# Patient Record
Sex: Female | Born: 1985 | Race: White | Hispanic: No | Marital: Married | Smoking: Former smoker
Health system: Southern US, Community
[De-identification: ages and names within clinical notes are randomized; demographics above are authoritative.]

## PROBLEM LIST (undated history)

## (undated) DIAGNOSIS — T8859XA Other complications of anesthesia, initial encounter: Secondary | ICD-10-CM

## (undated) DIAGNOSIS — K219 Gastro-esophageal reflux disease without esophagitis: Secondary | ICD-10-CM

## (undated) DIAGNOSIS — M419 Scoliosis, unspecified: Secondary | ICD-10-CM

## (undated) DIAGNOSIS — R112 Nausea with vomiting, unspecified: Secondary | ICD-10-CM

## (undated) DIAGNOSIS — F419 Anxiety disorder, unspecified: Secondary | ICD-10-CM

## (undated) DIAGNOSIS — Z9889 Other specified postprocedural states: Secondary | ICD-10-CM

## (undated) HISTORY — PX: ABDOMINAL HYSTERECTOMY: SHX81

## (undated) HISTORY — DX: Gastro-esophageal reflux disease without esophagitis: K21.9

## (undated) HISTORY — DX: Scoliosis, unspecified: M41.9

## (undated) HISTORY — PX: FRACTURE SURGERY: SHX138

## (undated) HISTORY — DX: Anxiety disorder, unspecified: F41.9

---
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
## 2006-06-03 HISTORY — PX: TUBAL LIGATION: SHX77

## 2006-06-03 HISTORY — PX: PARTIAL HYSTERECTOMY: SHX80

## 2006-06-07 ENCOUNTER — Emergency Department: Payer: Self-pay | Admitting: Emergency Medicine

## 2006-07-23 ENCOUNTER — Emergency Department: Payer: Self-pay | Admitting: Emergency Medicine

## 2006-09-26 ENCOUNTER — Emergency Department: Payer: Self-pay | Admitting: Emergency Medicine

## 2006-10-26 ENCOUNTER — Emergency Department: Payer: Self-pay | Admitting: Emergency Medicine

## 2007-01-29 ENCOUNTER — Emergency Department: Payer: Self-pay

## 2007-02-03 ENCOUNTER — Ambulatory Visit: Payer: Self-pay | Admitting: *Deleted

## 2007-02-05 ENCOUNTER — Inpatient Hospital Stay: Payer: Self-pay | Admitting: Obstetrics & Gynecology

## 2007-09-21 ENCOUNTER — Ambulatory Visit: Payer: Self-pay | Admitting: *Deleted

## 2007-09-25 ENCOUNTER — Emergency Department: Payer: Self-pay | Admitting: Emergency Medicine

## 2007-09-25 ENCOUNTER — Other Ambulatory Visit: Payer: Self-pay

## 2008-03-22 ENCOUNTER — Emergency Department: Payer: Self-pay | Admitting: Unknown Physician Specialty

## 2008-03-24 ENCOUNTER — Ambulatory Visit: Payer: Self-pay | Admitting: Unknown Physician Specialty

## 2008-03-25 ENCOUNTER — Ambulatory Visit: Payer: Self-pay

## 2008-08-11 ENCOUNTER — Emergency Department: Payer: Self-pay | Admitting: Emergency Medicine

## 2008-09-11 ENCOUNTER — Emergency Department: Payer: Self-pay | Admitting: Emergency Medicine

## 2009-04-28 ENCOUNTER — Emergency Department: Payer: Self-pay | Admitting: Emergency Medicine

## 2009-04-29 ENCOUNTER — Emergency Department: Payer: Self-pay | Admitting: Internal Medicine

## 2009-05-25 ENCOUNTER — Emergency Department: Payer: Self-pay | Admitting: Emergency Medicine

## 2009-09-23 ENCOUNTER — Emergency Department: Payer: Self-pay | Admitting: Emergency Medicine

## 2009-11-15 ENCOUNTER — Emergency Department: Payer: Self-pay | Admitting: Emergency Medicine

## 2010-02-07 ENCOUNTER — Emergency Department: Payer: Self-pay | Admitting: Emergency Medicine

## 2013-03-01 DIAGNOSIS — G8929 Other chronic pain: Secondary | ICD-10-CM | POA: Insufficient documentation

## 2015-03-14 ENCOUNTER — Ambulatory Visit: Payer: Self-pay | Admitting: Gastroenterology

## 2015-03-14 ENCOUNTER — Telehealth: Payer: Self-pay | Admitting: Gastroenterology

## 2015-03-14 DIAGNOSIS — K219 Gastro-esophageal reflux disease without esophagitis: Secondary | ICD-10-CM

## 2015-03-14 DIAGNOSIS — M419 Scoliosis, unspecified: Secondary | ICD-10-CM

## 2015-03-14 DIAGNOSIS — F419 Anxiety disorder, unspecified: Secondary | ICD-10-CM

## 2015-03-14 HISTORY — DX: Gastro-esophageal reflux disease without esophagitis: K21.9

## 2015-03-14 HISTORY — DX: Anxiety disorder, unspecified: F41.9

## 2015-03-14 HISTORY — DX: Scoliosis, unspecified: M41.9

## 2015-03-14 NOTE — Telephone Encounter (Signed)
Patient was a no show for 03/14/2015  appointment. Left voice message for her to call and reschedule.

## 2016-03-13 ENCOUNTER — Ambulatory Visit
Admission: RE | Admit: 2016-03-13 | Discharge: 2016-03-13 | Disposition: A | Discharge status: Home / Self Care | Payer: BLUE CROSS/BLUE SHIELD | Source: Ambulatory Visit | Attending: Family Medicine | Admitting: Family Medicine

## 2016-03-13 ENCOUNTER — Other Ambulatory Visit: Payer: Self-pay | Admitting: Family Medicine

## 2016-03-13 DIAGNOSIS — R109 Unspecified abdominal pain: Secondary | ICD-10-CM | POA: Diagnosis not present

## 2016-03-13 DIAGNOSIS — L0591 Pilonidal cyst without abscess: Secondary | ICD-10-CM | POA: Insufficient documentation

## 2016-03-22 ENCOUNTER — Other Ambulatory Visit: Payer: Self-pay | Admitting: Neurological Surgery

## 2016-03-22 DIAGNOSIS — G96191 Perineural cyst: Secondary | ICD-10-CM

## 2016-03-22 DIAGNOSIS — G548 Other nerve root and plexus disorders: Principal | ICD-10-CM

## 2016-04-04 ENCOUNTER — Ambulatory Visit
Admission: RE | Admit: 2016-04-04 | Discharge: 2016-04-04 | Disposition: A | Discharge status: Home / Self Care | Payer: BLUE CROSS/BLUE SHIELD | Source: Ambulatory Visit | Attending: Neurological Surgery | Admitting: Neurological Surgery

## 2016-04-04 DIAGNOSIS — G548 Other nerve root and plexus disorders: Principal | ICD-10-CM

## 2016-04-04 DIAGNOSIS — G96191 Perineural cyst: Secondary | ICD-10-CM

## 2016-04-08 ENCOUNTER — Encounter: Payer: Self-pay | Admitting: Radiology

## 2016-04-08 ENCOUNTER — Ambulatory Visit
Admission: RE | Admit: 2016-04-08 | Discharge: 2016-04-08 | Disposition: A | Discharge status: Home / Self Care | Payer: BLUE CROSS/BLUE SHIELD | Source: Ambulatory Visit | Attending: Neurological Surgery | Admitting: Neurological Surgery

## 2016-04-08 MED ORDER — GADOBENATE DIMEGLUMINE 529 MG/ML IV SOLN: 15.0000 mL | Freq: Once | INTRAVENOUS | Status: DC | PRN

## 2016-04-19 ENCOUNTER — Ambulatory Visit
Admission: RE | Admit: 2016-04-19 | Discharge: 2016-04-19 | Disposition: A | Discharge status: Home / Self Care | Payer: BLUE CROSS/BLUE SHIELD | Source: Ambulatory Visit | Attending: Neurological Surgery | Admitting: Neurological Surgery

## 2016-04-19 DIAGNOSIS — M4185 Other forms of scoliosis, thoracolumbar region: Secondary | ICD-10-CM | POA: Insufficient documentation

## 2016-04-19 DIAGNOSIS — G9619 Other disorders of meninges, not elsewhere classified: Secondary | ICD-10-CM | POA: Insufficient documentation

## 2016-04-19 MED ORDER — GADOBENATE DIMEGLUMINE 529 MG/ML IV SOLN
15.0000 mL | Freq: Once | INTRAVENOUS | Status: AC | PRN
  Administered 2016-04-19: 14 mL via INTRAVENOUS

## 2016-09-24 ENCOUNTER — Emergency Department: Payer: Self-pay

## 2016-09-24 ENCOUNTER — Encounter: Payer: Self-pay | Admitting: Medical Oncology

## 2016-09-24 ENCOUNTER — Emergency Department
Admission: EM | Admit: 2016-09-24 | Discharge: 2016-09-24 | Disposition: A | Discharge status: Home / Self Care | Payer: BLUE CROSS/BLUE SHIELD | Attending: Emergency Medicine | Admitting: Emergency Medicine

## 2016-09-24 DIAGNOSIS — Z87891 Personal history of nicotine dependence: Secondary | ICD-10-CM | POA: Insufficient documentation

## 2016-09-24 DIAGNOSIS — R1012 Left upper quadrant pain: Secondary | ICD-10-CM | POA: Insufficient documentation

## 2016-09-24 DIAGNOSIS — R112 Nausea with vomiting, unspecified: Secondary | ICD-10-CM | POA: Insufficient documentation

## 2016-09-24 LAB — COMPREHENSIVE METABOLIC PANEL
ALT: 12 U/L — ABNORMAL LOW (ref 14–54)
AST: 16 U/L (ref 15–41)
Albumin: 4.4 g/dL (ref 3.5–5.0)
Alkaline Phosphatase: 43 U/L (ref 38–126)
Anion gap: 6 (ref 5–15)
BILIRUBIN TOTAL: 0.9 mg/dL (ref 0.3–1.2)
BUN: 9 mg/dL (ref 6–20)
CALCIUM: 8.9 mg/dL (ref 8.9–10.3)
CO2: 26 mmol/L (ref 22–32)
Chloride: 105 mmol/L (ref 101–111)
Creatinine, Ser: 0.73 mg/dL (ref 0.44–1.00)
GFR calc non Af Amer: >60 mL/min (ref >60)
Glucose, Bld: 95 mg/dL (ref 65–99)
Potassium: 4 mmol/L (ref 3.5–5.1)
SODIUM: 137 mmol/L (ref 135–145)
TOTAL PROTEIN: 7.2 g/dL (ref 6.5–8.1)

## 2016-09-24 LAB — CBC
HCT: 39.6 % (ref 35.0–47.0)
HEMOGLOBIN: 13.6 g/dL (ref 12.0–16.0)
MCH: 31.6 pg (ref 26.0–34.0)
MCHC: 34.3 g/dL (ref 32.0–36.0)
MCV: 92.1 fL (ref 80.0–100.0)
Platelets: 225 10*3/uL (ref 150–440)
RBC: 4.3 MIL/uL (ref 3.80–5.20)
RDW: 11.9 % (ref 11.5–14.5)
WBC: 6 10*3/uL (ref 3.6–11.0)

## 2016-09-24 LAB — URINALYSIS, COMPLETE (UACMP) WITH MICROSCOPIC
Bacteria, UA: NONE SEEN
Bilirubin Urine: NEGATIVE
GLUCOSE, UA: NEGATIVE mg/dL
KETONES UR: NEGATIVE mg/dL
Leukocytes, UA: NEGATIVE
NITRITE: NEGATIVE
PROTEIN: NEGATIVE mg/dL
Specific Gravity, Urine: 1.018 (ref 1.005–1.030)
pH: 5 (ref 5.0–8.0)

## 2016-09-24 LAB — LIPASE, BLOOD: Lipase: 24 U/L (ref 11–51)

## 2016-09-24 LAB — POCT PREGNANCY, URINE: Preg Test, Ur: NEGATIVE

## 2016-09-24 MED ORDER — KETOROLAC TROMETHAMINE 30 MG/ML IJ SOLN
15.0000 mg | Freq: Once | INTRAMUSCULAR | Status: AC
  Administered 2016-09-24: 15 mg via INTRAVENOUS
  Filled 2016-09-24: qty 1

## 2016-09-24 MED ORDER — METOCLOPRAMIDE HCL 5 MG/ML IJ SOLN
10.0000 mg | Freq: Once | INTRAMUSCULAR | Status: AC
  Administered 2016-09-24: 10 mg via INTRAVENOUS
  Filled 2016-09-24: qty 2

## 2016-09-24 MED ORDER — METOCLOPRAMIDE HCL 10 MG PO TABS: 10.0000 mg | ORAL_TABLET | Freq: Three times a day (TID) | ORAL | 0 refills | Status: DC | PRN

## 2016-09-24 MED ORDER — ONDANSETRON HCL 4 MG/2ML IJ SOLN
4.0000 mg | Freq: Once | INTRAMUSCULAR | Status: AC
  Administered 2016-09-24: 4 mg via INTRAVENOUS
  Filled 2016-09-24: qty 2

## 2016-09-24 MED ORDER — ALUM & MAG HYDROXIDE-SIMETH 400-400-40 MG/5ML PO SUSP: 5.0000 mL | Freq: Four times a day (QID) | ORAL | 0 refills | Status: DC | PRN

## 2016-09-24 MED ORDER — FAMOTIDINE 20 MG PO TABS: 20.0000 mg | ORAL_TABLET | Freq: Two times a day (BID) | ORAL | 1 refills | Status: DC

## 2016-09-24 MED ORDER — ACETAMINOPHEN 500 MG PO TABS
1000.0000 mg | ORAL_TABLET | Freq: Once | ORAL | Status: AC
  Administered 2016-09-24: 1000 mg via ORAL
  Filled 2016-09-24: qty 2

## 2016-09-24 MED ORDER — SODIUM CHLORIDE 0.9 % IV BOLUS (SEPSIS)
1000.0000 mL | Freq: Once | INTRAVENOUS | Status: AC
  Administered 2016-09-24: 1000 mL via INTRAVENOUS

## 2016-09-24 NOTE — ED Triage Notes (Signed)
Pt reports general abd pain that began 2 hrs pta, pt reports NV also. Pt states that she just finished her period yesterday but after she vomited she passed a large vaginal clot.

## 2016-09-24 NOTE — ED Provider Notes (Signed)
[facility] Emergency Department Provider Note  ____________________________________________  Time seen: Approximately 11:00 AM  I have reviewed the triage vital signs and the nursing notes.   HISTORY  Chief Complaint Abdominal Pain   HPI Gabrielle Parsons is a 31 y.o. female with a history of GERD who presents for evaluation of abdominal pain.Patient reports that she was in her usual state of health this morning. She went to work and when she got there she started vomiting. She reports that she had more than 20 episodes of nonbloody nonbilious emesis. She reports that she also started having left-sided abdominal pain. She reports that the pain initially started in her left flank and now moved to her left abdomen. The pain is dull and constant with sharp intermittent severe component, located in the left side of her abdomen, constant. Currently 5 out of 10. She denies diarrhea, fever or chills, hematuria or dysuria. She reports that she finished her menstrual period 2 days ago however today she was vomiting so hard that she passed a blood clot through her vagina. Patient has had 2 C-sections and a partial hysterectomy. No other abdominal surgeries.  Past Medical History:  Diagnosis Date  . Anxiety disorder 03/14/2015  . GERD (gastroesophageal reflux disease) 03/14/2015  . Scoliosis 03/14/2015    Patient Active Problem List   Diagnosis Date Noted  . Anxiety disorder 03/14/2015  . GERD (gastroesophageal reflux disease) 03/14/2015  . Scoliosis 03/14/2015    Past Surgical History:  Procedure Laterality Date  . CESAREAN SECTION     2006, 2013  . PARTIAL HYSTERECTOMY  2008  . TONSILLECTOMY  1991  . TUBAL LIGATION  2008    Prior to Admission medications   Medication Sig Start Date End Date Taking? Authorizing Provider  alum & mag hydroxide-simeth (MAALOX MAX) 400-400-40 MG/5ML suspension Take 5 mLs by mouth every 6 (six) hours as needed for indigestion.  09/24/16   Nita Sickle, MD  famotidine (PEPCID) 20 MG tablet Take 1 tablet (20 mg total) by mouth 2 (two) times daily. 09/24/16 09/24/17  Nita Sickle, MD  metoCLOPramide (REGLAN) 10 MG tablet Take 1 tablet (10 mg total) by mouth every 8 (eight) hours as needed for nausea. 09/24/16 09/27/16  Nita Sickle, MD    Allergies Patient has no known allergies.  Family History  Problem Relation Age of Onset  . Colon cancer Maternal Grandmother   . Hypertension Mother   . Stroke Father   . Heart disease Father   . Heart disease Paternal Grandfather   . Heart disease Maternal Grandfather   . Diabetes Paternal Grandmother     Social History Social History  Substance Use Topics  . Smoking status: Former Games developer  . Smokeless tobacco: Not on file  . Alcohol use 0.0 oz/week    Review of Systems  Constitutional: Negative for fever. Eyes: Negative for visual changes. ENT: Negative for sore throat. Neck: No neck pain  Cardiovascular: Negative for chest pain. Respiratory: Negative for shortness of breath. Gastrointestinal: + L sided abdominal pain, nausea, and vomiting. No diarrhea. Genitourinary: Negative for dysuria. Musculoskeletal: Negative for back pain. Skin: Negative for rash. Neurological: Negative for headaches, weakness or numbness. Psych: No SI or HI  ____________________________________________   PHYSICAL EXAM:  VITAL SIGNS: ED Triage Vitals  Enc Vitals Group     BP 09/24/16 1006 (!) 142/88     Pulse Rate 09/24/16 1006 77     Resp 09/24/16 1006 18     Temp 09/24/16 1006  97.7 F (36.5 C)     Temp Source 09/24/16 1006 Oral     SpO2 09/24/16 1006 99 %     Weight 09/24/16 1006 155 lb (70.3 kg)     Height 09/24/16 1006  (1.778 m)     Head Circumference --      Peak Flow --      Pain Score 09/24/16 1005 7     Pain Loc --      Pain Edu? --      Excl. in GC? --     Constitutional: Alert and oriented, Looks uncomfortable and actively  vomiting. HEENT:      Head: Normocephalic and atraumatic.         Eyes: Conjunctivae are normal. Sclera is non-icteric. EOMI. PERRL      Mouth/Throat: Mucous membranes are moist.       Neck: Supple with no signs of meningismus. Cardiovascular: Regular rate and rhythm. No murmurs, gallops, or rubs. 2+ symmetrical distal pulses are present in all extremities. No JVD. Respiratory: Normal respiratory effort. Lungs are clear to auscultation bilaterally. No wheezes, crackles, or rhonchi.  Gastrointestinal: Soft, ttp over the epigastric/ LUQ/ LLQ, and non distended with positive bowel sounds. No rebound or guarding. Genitourinary: No CVA tenderness. Musculoskeletal: Nontender with normal range of motion in all extremities. No edema, cyanosis, or erythema of extremities. Neurologic: Normal speech and language. Face is symmetric. Moving all extremities. No gross focal neurologic deficits are appreciated. Skin: Skin is warm, dry and intact. No rash noted. Psychiatric: Mood and affect are normal. Speech and behavior are normal.  ____________________________________________   LABS (all labs ordered are listed, but only abnormal results are displayed)  Labs Reviewed  COMPREHENSIVE METABOLIC PANEL - Abnormal; Notable for the following:       Result Value   ALT 12 (*)    All other components within normal limits  URINALYSIS, COMPLETE (UACMP) WITH MICROSCOPIC - Abnormal; Notable for the following:    Color, Urine YELLOW (*)    APPearance CLEAR (*)    Hgb urine dipstick SMALL (*)    Squamous Epithelial / LPF 0-5 (*)    All other components within normal limits  LIPASE, BLOOD  CBC  POC URINE PREG, ED  POCT PREGNANCY, URINE   ____________________________________________  EKG  none  ____________________________________________  RADIOLOGY  CT a/p: Negative  ____________________________________________   PROCEDURES  Procedure(s) performed: None Procedures Critical Care performed:   None ____________________________________________   INITIAL IMPRESSION / ASSESSMENT AND PLAN / ED COURSE   31 y.o. female with a history of GERD who presents for evaluation of L flank and L sided abdominal pain associated with N/V. Patient is actively vomiting and looks uncomfortable. Her vital signs are within normal limits, she is afebrile, her abdomen is soft and nondistended with tenderness in the epigastric, left upper and lower quadrants. Differential diagnoses including kidney stone, gallbladder, gastritis, peptic ulcer disease, pancreatitis. Plan for zofran, IVF, toradol, labs, UA.  Clinical Course as of Sep 25 1406  Tue Sep 24, 2016  1152 CT abdomen and pelvis, labs, urinalysis and urine pregnancy all with no acute findings. Patient reports that her pain is mostly in the left upper quadrant now dull and mild, markedly improved however still feeling nauseous and had one episode of vomiting after the zofran. She has no right lower or left lower quadrant tenderness on exam. No right upper quadrant tenderness on exam. We'll give another round antiemetic and a second liter fluid.  [CV]  1407 Patient feels improved. Tolerating PO. Will dc home on maalox, pepcid, reglan and f/u with PCP.  [CV]    Clinical Course User Index [CV] Nita Sickle, MD    Pertinent labs & imaging results that were available during my care of the patient were reviewed by me and considered in my medical decision making (see chart for details).    ____________________________________________   FINAL CLINICAL IMPRESSION(S) / ED DIAGNOSES  Final diagnoses:  Non-intractable vomiting with nausea, unspecified vomiting type  LUQ abdominal pain      NEW MEDICATIONS STARTED DURING THIS VISIT:  New Prescriptions   ALUM & MAG HYDROXIDE-SIMETH (MAALOX MAX) 400-400-40 MG/5ML SUSPENSION    Take 5 mLs by mouth every 6 (six) hours as needed for indigestion.   FAMOTIDINE (PEPCID) 20 MG TABLET    Take 1 tablet (20 mg  total) by mouth 2 (two) times daily.   METOCLOPRAMIDE (REGLAN) 10 MG TABLET    Take 1 tablet (10 mg total) by mouth every 8 (eight) hours as needed for nausea.     Note:  This document was prepared using Dragon voice recognition software and may include unintentional dictation errors.    Nita Sickle, MD 09/24/16 1409

## 2016-09-24 NOTE — ED Notes (Signed)
Electronic signature pad not working at this time. Paper copy signed and placed in chart. 

## 2016-09-24 NOTE — Discharge Instructions (Signed)

## 2016-09-24 NOTE — ED Notes (Signed)
Patient taken to CT scan.

## 2018-03-15 IMAGING — CT CT RENAL STONE PROTOCOL
3 of 4 series · 7 of 46 positions shown, 13 images · non-contrast
Comparison: 03/13/2016

CLINICAL DATA: Generalized abdominal pain beginning this morning.
Left-sided flank and abdominal pain.

EXAM:
CT ABDOMEN AND PELVIS WITHOUT CONTRAST
TECHNIQUE: Multidetector CT imaging of the abdomen and pelvis was performed
following the standard protocol without IV contrast.

[Series 4: lung bases · axial · 0.74mm/px · z∈[-227,-187]mm · 3 of 16 slices shown, 7 images]
[im 4/16  soft-tissue]
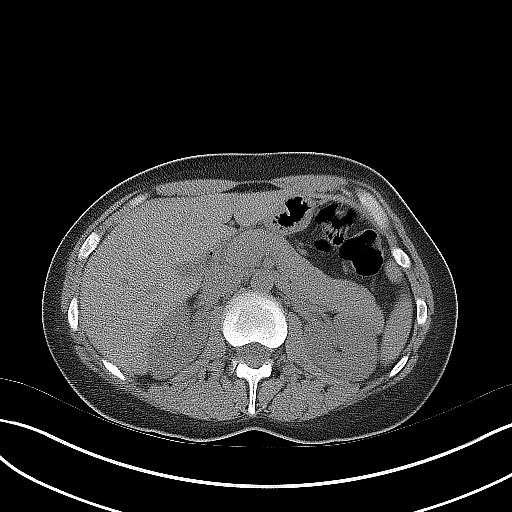
[im 4/16  lung]
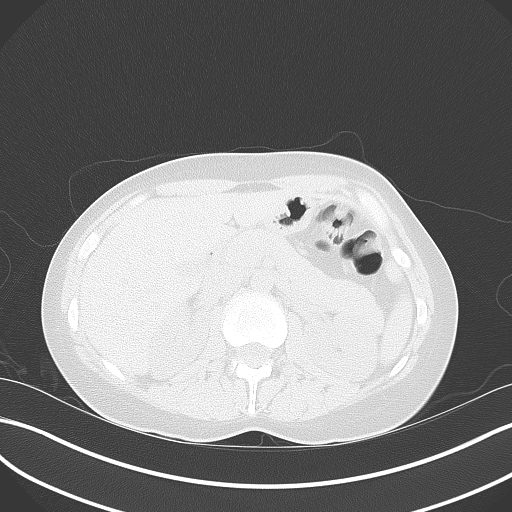
[im 4/16  bone]
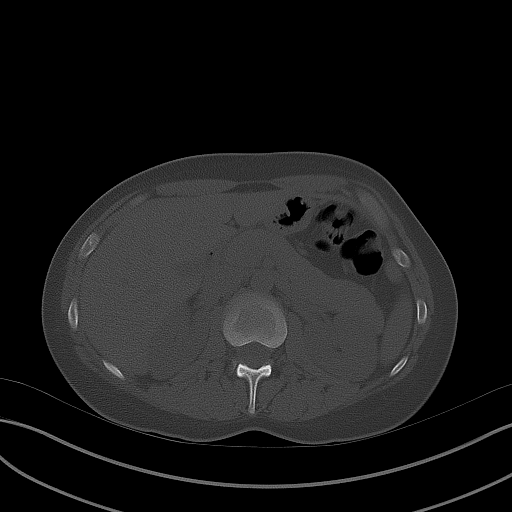
[im 8/16  soft-tissue]
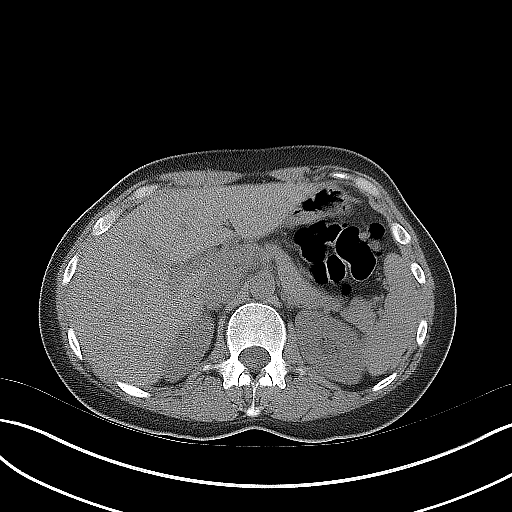
[im 8/16  lung]
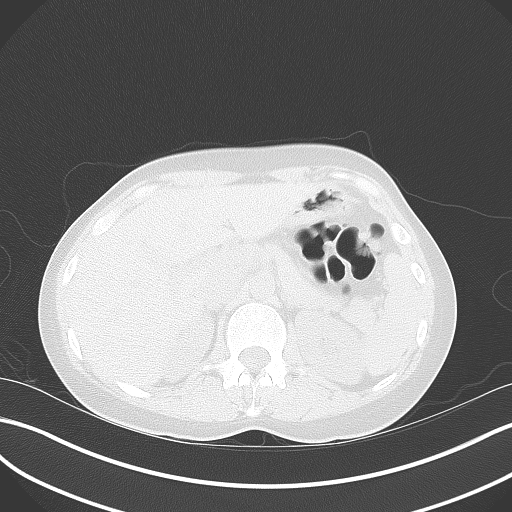
[im 12/16  soft-tissue]
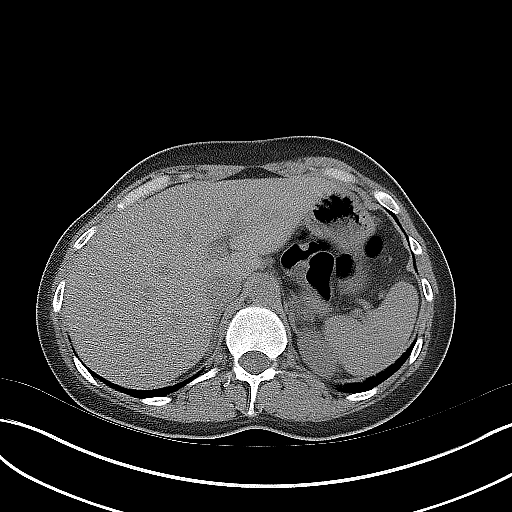
[im 12/16  lung]
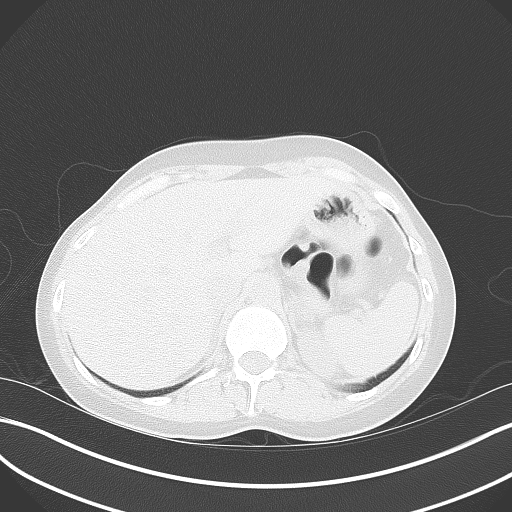

[Series 5: coronal · coronal · 0.71mm/px · 3 of 107 slices shown, 4 images]
[im 36/107  soft-tissue]
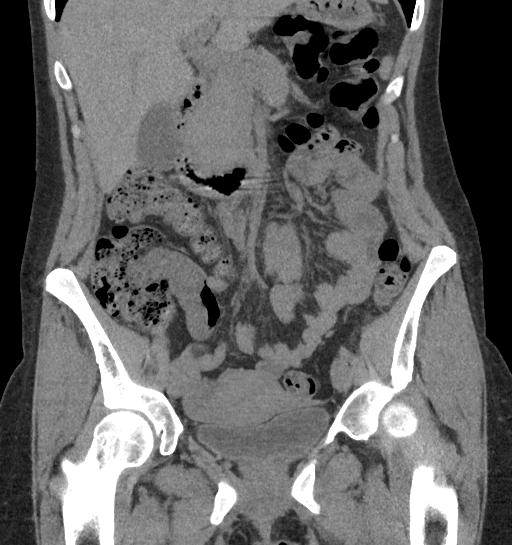
[im 48/107  soft-tissue]
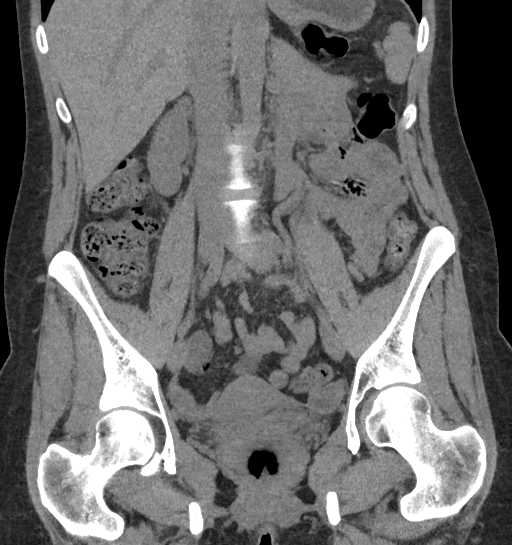
[im 48/107  bone]
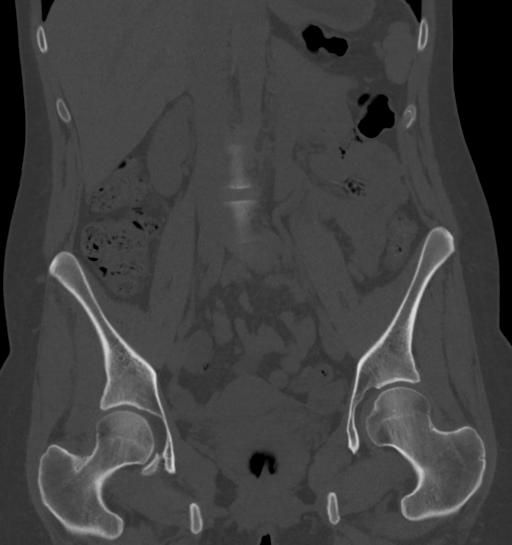
[im 59/107  soft-tissue]
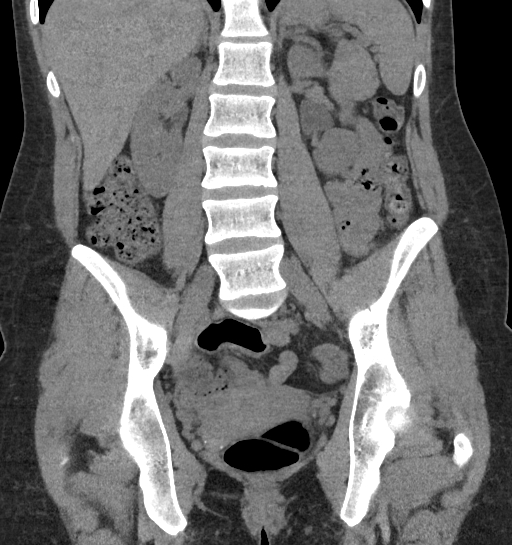

[Series 6: sagittal · sagittal · 0.50mm/px · 1 of 156 slices shown, 2 images]
[im 52/156  soft-tissue]
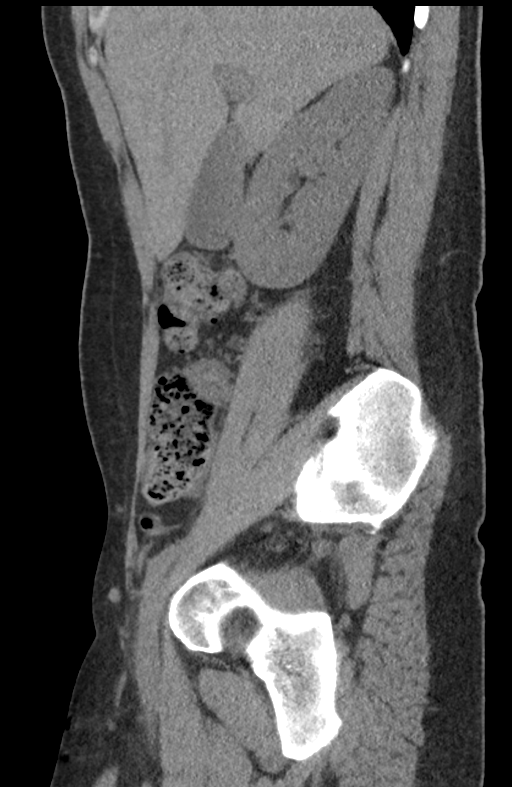
[im 52/156  bone]
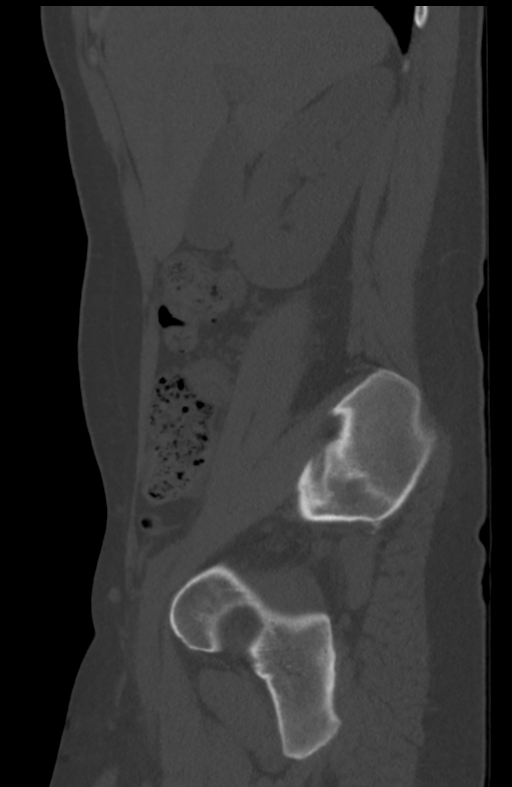

[7 of 46 positions shown; findings below may reference images not displayed]

FINDINGS: Lower chest: Negative

Hepatobiliary: Entire liver not included. Visualized portion appears
normal. No calcified gallstones.

Pancreas: Normal

Spleen: Normal

Adrenals/Urinary Tract: Adrenal glands are normal. Kidneys are
normal. No cyst, mass, stone or hydronephrosis.

Stomach/Bowel: Normal. Moderate amount of fecal matter within the
colon above within limits of normal.

Vascular/Lymphatic: Normal

Reproductive: Normal

Other: No free fluid or air.

Musculoskeletal: Minimal spinal curvature.
IMPRESSION: Negative CT. No abnormality seen to explain the presenting symptoms.

## 2018-11-02 DIAGNOSIS — Z8041 Family history of malignant neoplasm of ovary: Secondary | ICD-10-CM

## 2018-11-02 DIAGNOSIS — Z1371 Encounter for nonprocreative screening for genetic disease carrier status: Secondary | ICD-10-CM

## 2018-11-02 HISTORY — DX: Family history of malignant neoplasm of ovary: Z80.41

## 2018-11-02 HISTORY — DX: Encounter for nonprocreative screening for genetic disease carrier status: Z13.71

## 2018-11-18 ENCOUNTER — Ambulatory Visit (INDEPENDENT_AMBULATORY_CARE_PROVIDER_SITE_OTHER): Payer: 59 | Admitting: Obstetrics & Gynecology

## 2018-11-18 ENCOUNTER — Encounter: Payer: Self-pay | Admitting: Obstetrics & Gynecology

## 2018-11-18 ENCOUNTER — Other Ambulatory Visit: Payer: Self-pay

## 2018-11-18 ENCOUNTER — Other Ambulatory Visit (HOSPITAL_COMMUNITY)
Admission: RE | Admit: 2018-11-18 | Discharge: 2018-11-18 | Disposition: A | Discharge status: Home / Self Care | Payer: BLUE CROSS/BLUE SHIELD | Source: Ambulatory Visit | Attending: Obstetrics & Gynecology | Admitting: Obstetrics & Gynecology

## 2018-11-18 VITALS — BP 130/80 | Ht 70.0 in | Wt 182.0 lb

## 2018-11-18 DIAGNOSIS — Z124 Encounter for screening for malignant neoplasm of cervix: Secondary | ICD-10-CM | POA: Insufficient documentation

## 2018-11-18 DIAGNOSIS — N3941 Urge incontinence: Secondary | ICD-10-CM

## 2018-11-18 DIAGNOSIS — Z01419 Encounter for gynecological examination (general) (routine) without abnormal findings: Secondary | ICD-10-CM | POA: Diagnosis not present

## 2018-11-18 DIAGNOSIS — R102 Pelvic and perineal pain: Secondary | ICD-10-CM

## 2018-11-18 DIAGNOSIS — N9419 Other specified dyspareunia: Secondary | ICD-10-CM

## 2018-11-18 DIAGNOSIS — R5383 Other fatigue: Secondary | ICD-10-CM

## 2018-11-18 NOTE — Patient Instructions (Signed)
Multigene Panel Testing for Cancer  What is cancer? Normal cells in the body grow, divide, and are replaced on a routine basis. Sometimes, cells divide abnormally and begin to grow out of control. These cells may form growths or tumors. Tumors can be benign (not cancer) or malignant (cancer). Benign tumors do not spread to other body tissues. Cancer tumors can invade and destroy nearby healthy tissues, bones, and organs. Cancer cells also can spread to other parts of the body and form new cancerous areas.  What causes cancer? Cancer is caused by many different factors. A few types of cancer are caused by changes in genes that can be passed from parent to child. Changes in genes are called mutations. Certain gene mutations are associated with family cancer syndromes. What are family cancer syndromes?  Family cancer syndromes are genetic conditions that increase the risk of certain types of cancer. They also are called hereditary or inherited cancer syndromes. Common family cancer syndromes include hereditary breast and ovarian cancer (HBOC) syndrome, Lynch syndrome, Li-Fraumeni syndrome, Cowden syndrome, and Peutz-Jeghers syndrome. What is genetic testing for cancer? Genetic testing for cancer looks for mutations in certain genes that are known to be linked to cancer. The results can help determine your risk of developing a disease like cancer or passing on a genetic disorder. What is multigene panel testing? Multigene panel testing is a type of genetic testing that looks for mutations in several genes at once. This is different from single-gene testing, which looks for a mutation in a specific gene. Single-gene testing is often used when there is already a known gene mutation in a family. For example, testing for BRCA mutations only looks for changes in BRCA1 and BRCA2 genes. Who should have genetic testing? You may consider genetic testing if your personal or family history shows that you have an  increased risk of cancer. Your obstetrician-gynecologist (ob-gyn) or other health care professional may ask you these and other questions: . Have you or any family members been diagnosed with cancer?  . If yes, which family members were diagnosed, with what types of cancer, and at what ages?  . Were you or any of your family members born with birth defects?  . Are you of Eastern or Central European Jewish ancestry? Depending on your answers, your ob-gyn or other health care professional may suggest that you talk about genetic testing with a genetic counselor or a physician who is an expert in genetics. You can choose to have genetic testing, or you can choose not to. Before you decide, you should have genetic counseling. What is genetic counseling? In genetic counseling, you will talk with a genetic counselor or physician expert about the following: . Your risk of getting a hereditary type of cancer  . Who in your family could potentially get tested  . How testing is done  . What the test results may mean  . What you may do depending on the results How is genetic testing done? Genetic testing typically is done from a blood sample or saliva sample. When is multigene panel testing recommended? Multigene panel testing may be useful if you . are at risk of a family cancer syndrome that has more than one gene associated with it  . have a personal or family history of cancer and single-gene testing has not found a mutation, or the result is uncertain What are the benefits of multigene panel testing? Multigene panel testing looks at multiple genes with one test. If a gene   mutation is found, multigene panel testing may . give you a better understanding of your cancer risk than single-gene testing  . help your health care team decide what cancer screenings you might need beyond routine screenings  . help you think about what you can do to prevent cancer What are the risks of multigene panel  testing? The risks of multigene panel testing may include the following: . Results can be complicated to interpret.  . Testing may find gene mutations that show a moderate or uncertain risk of cancer.  . It may be hard to know what you should do with your test results. You should talk with a genetic counselor or physician expert before and after genetic testing to learn what the results mean. If I have a gene mutation, should I tell my family? Having a gene mutation means you can pass the mutation to your children. Your siblings also may have the gene mutation. Although you do not have to tell your family members, sharing the information could be life-saving for them. With this information, your family members can decide whether to be tested and get cancer screenings at an early age. How can I prevent cancer if I test positive for a gene mutation? If you test positive for a gene mutation, you can discuss cancer screening and prevention options with your ob-gyn, genetic counselor, or other health care professional. It may be helpful to have earlier or more frequent cancer screening tests, which can find cancer at an early and more curable stage. Risk reduction steps like medication, surgery, and lifestyle changes also may be recommended. I'm concerned about discrimination based on genetic testing results. What should I know? Many people are concerned about possible employment discrimination or denial of insurance coverage based on genetic testing results. The Genetic Information Nondiscrimination Act of 2008 (GINA) makes it illegal for health insurers to require genetic testing results or use results to make decisions about coverage, rates, or preexisting conditions. GINA also makes it illegal for employers to discriminate against employees or applicants because of genetic information. GINA does not apply to life insurance, long-term care insurance, or disability insurance. What should I know about  direct-to-consumer genetic tests? Direct-to-consumer (or at-home) genetic tests are sold over the internet. You do not need a doctor's order to get one. The [employer] discourages use of direct-to-consumer genetic tests because the results may be misleading. For example, one commercial test for BRCA mutations only looks for three mutations, even though there are more than 500 BRCA mutations linked to cancer. The test results could cause unnecessary fear, or a false sense that you are not at risk. You should see a health care professional if you want a genetic test.  Glossary BRCA1 and BRCA2: Genes that keep cells from growing too rapidly. Changes in these genes have been linked to an increased risk of breast cancer and ovarian cancer.  Cowden Syndrome: A genetic condition that increases a person's risk of cancer of the breast, thyroid, uterus, colon, kidney, and skin. Genes: Segments of DNA that contain instructions for the development of a person's physical traits and control of the processes in the body. The gene is the basic unit of heredity and can be passed from parent to child. Genetic Counselor: A health care professional with special training in genetics who can provide expert advice about genetic disorders and prenatal testing. Hereditary Breast and Ovarian Cancer (HBOC) Syndrome: A genetic condition that increases a person's risk of cancer  of the breast, ovary, prostate, pancreas, and skin (melanoma). Li-Fraumeni Syndrome: A genetic condition that increases a person's risk of cancer of the breast, bones, soft tissue, brain, and outer layer of the adrenal glands. Lynch Syndrome: A genetic condition that increases a person's risk of cancer of the colon, rectum, ovary, uterus, pancreas, and bile duct. Multigene Panel Testing: A type of genetic test that can look for mutations in multiple genes at once. Mutations: Changes in genes that can be passed from  parent to child. Obstetrician-Gynecologist (Ob-Gyn): A doctor with special training and education in women's health. Peutz-Jeghers Syndrome: A genetic condition that increases a person's risk of cancer of the stomach, intestines, pancreas, cervix, ovary, and breast.

## 2018-11-18 NOTE — Progress Notes (Signed)
HPI:      [name] is a 33 y.o. [id number] who LMP was Patient's last menstrual period was 10/19/2018., she presents today for her annual examination. The patient has no complaints today other than INCREASING URINARY URGENCY with INCONTIENCE at times as well as DYSPAREUNIA and urinary frequency associated with that as well.  Often has to go to bathroom on a moments notice.  No real stress incontinence.  No prior h/o endometriosis, IC.  Prior ovarian and tubal cysts, s/p LS in past.  Has had right tube tied as well.  Reg cycles.  The patient is sexually active. Her last pap: approximate date 2016 and was normal. The patient does perform self breast exams.  There is notable family history of breast or ovarian cancer in her family.  OVARIAN CANCER, MGM. The patient has regular exercise: yes.  The patient denies current symptoms of depression.    GYN History: Contraception: tubal ligation  PMHx: Past Medical History:  Diagnosis Date  . Anxiety disorder 03/14/2015  . GERD (gastroesophageal reflux disease) 03/14/2015  . Scoliosis 03/14/2015   Past Surgical History:  Procedure Laterality Date  . CESAREAN SECTION     2006, 2013  . PARTIAL HYSTERECTOMY  2008  . TONSILLECTOMY  1991  . TUBAL LIGATION  2008   Family History  Problem Relation Age of Onset  . Colon cancer Maternal Grandmother   . Hypertension Mother   . Stroke Father   . Heart disease Father   . Heart disease Paternal Grandfather   . Heart disease Maternal Grandfather   . Diabetes Paternal Grandmother    Social History   Tobacco Use  . Smoking status: Former Research scientist (life sciences)  . Smokeless tobacco: Never Used  Substance Use Topics  . Alcohol use: Yes    Alcohol/week: 0.0 standard drinks  . Drug use: No    Current Outpatient Medications:  .  phentermine (ADIPEX-P) 37.5 MG tablet, Take 37.5 mg by mouth daily before breakfast., Disp: , Rfl:  .  famotidine (PEPCID) 20 MG tablet, Take 1 tablet (20 mg total) by mouth 2 (two)  times daily., Disp: 60 tablet, Rfl: 1 .  metoCLOPramide (REGLAN) 10 MG tablet, Take 1 tablet (10 mg total) by mouth every 8 (eight) hours as needed for nausea., Disp: 20 tablet, Rfl: 0 Allergies: Patient has no known allergies.  Review of Systems  Constitutional: Negative for chills, fever and malaise/fatigue.  HENT: Negative for congestion, sinus pain and sore throat.   Eyes: Negative for blurred vision and pain.  Respiratory: Negative for cough and wheezing.   Cardiovascular: Negative for chest pain and leg swelling.  Gastrointestinal: Negative for abdominal pain, constipation, diarrhea, heartburn, nausea and vomiting.  Genitourinary: Negative for dysuria, frequency, hematuria and urgency.  Musculoskeletal: Negative for back pain, joint pain, myalgias and neck pain.  Skin: Negative for itching and rash.  Neurological: Negative for dizziness, tremors and weakness.  Endo/Heme/Allergies: Does not bruise/bleed easily.  Psychiatric/Behavioral: Negative for depression. The patient is not nervous/anxious and does not have insomnia.     Objective: BP 130/80   Ht _0  (1.778 m)   Wt 182 lb (82.6 kg)   LMP 10/19/2018   BMI 26.11 kg/m   Filed Weights   11/18/18 1457  Weight: 182 lb (82.6 kg)   Body mass index is 26.11 kg/m. Physical Exam Constitutional:      General: She is not in acute distress.    Appearance: She is well-developed.  Genitourinary:  Pelvic exam was performed with patient supine.     Vagina, uterus and rectum normal.     No lesions in the vagina.     No vaginal bleeding.     No cervical motion tenderness, friability, lesion or polyp.     Uterus is mobile.     Uterus is not enlarged.     No uterine mass detected.    Uterus is midaxial.     No right or left adnexal mass present.     Right adnexa not tender.     Left adnexa not tender.  HENT:     Head: Normocephalic and atraumatic. No laceration.     Right Ear: Hearing normal.     Left Ear: Hearing normal.      Mouth/Throat:     Pharynx: Uvula midline.  Eyes:     Pupils: Pupils are equal, round, and reactive to light.  Neck:     Musculoskeletal: Normal range of motion and neck supple.     Thyroid: No thyromegaly.  Cardiovascular:     Rate and Rhythm: Normal rate and regular rhythm.     Heart sounds: No murmur. No friction rub. No gallop.   Pulmonary:     Effort: Pulmonary effort is normal. No respiratory distress.     Breath sounds: Normal breath sounds. No wheezing.  Chest:     Breasts:        Right: No mass, skin change or tenderness.        Left: No mass, skin change or tenderness.  Abdominal:     General: Bowel sounds are normal. There is no distension.     Palpations: Abdomen is soft.     Tenderness: There is no abdominal tenderness. There is no rebound.  Musculoskeletal: Normal range of motion.  Neurological:     Mental Status: She is alert and oriented to person, place, and time.     Cranial Nerves: No cranial nerve deficit.  Skin:    General: Skin is warm and dry.  Psychiatric:        Judgment: Judgment normal.  Vitals signs reviewed.     Assessment:  ANNUAL EXAM 1. Women's annual routine gynecological examination   2. Screening for cervical cancer   3. Urgency incontinence   4. Pelvic pain   5. Dyspareunia due to medical condition in female   6. Fatigue, unspecified type      Screening Plan:            1.  Cervical Screening-  Pap smear done today  2. Breast screening- Exam annually and mammogram>40 planned   3. Colonoscopy every 10 years, Hemoccult testing - after age 50  4. Labs Ordered today  Also Labs for [employer] based on FH  She presents with a significant personal and/or family history of OVARIAN cancer maternal grandmother died 32s. Details of which can be found in her medical/family history. She does not have a previously identified BRCA and Lynch syndrome mutation in her family. Due to her personal and/or family history of cancer she is a  candidate for the [facility] test(s).    Risk for cancer, genetic susceptibility discussed.  Patient has requested gene testing.  Discussed BRCA as well as Lynch syndrome and other cancer risk assessments available based on her family history and personal history. Pros and cons of testing discussed.   5. Counseling for contraception: bilateral tubal ligation   6. Urgency incontinence - [place] and urine studies - Urine Culture - Consider  OAB as etiology, trial of medicine to be considered after testing  7. Pelvic pain - Potential for endometriosis, IC, OAB, uterine abnormality discussed - US PELVIC COMPLETE WITH TRANSVAGINAL; Future  8. Dyspareunia due to medical condition in female - As above - US PELVIC COMPLETE WITH TRANSVAGINAL; Future  9. Fatigue, unspecified type - TSH      F/U  Return in about 1 week (around 11/25/2018) for Follow up w GYN [place].  Barnett Applebaum, MD, [facility], [facility] 11/18/2018  3:34 PM

## 2018-11-19 ENCOUNTER — Encounter: Payer: Self-pay | Admitting: Obstetrics and Gynecology

## 2018-11-19 LAB — TSH: TSH: 2.57 u[IU]/mL (ref 0.450–4.500)

## 2018-11-20 ENCOUNTER — Other Ambulatory Visit: Payer: Self-pay | Admitting: Obstetrics & Gynecology

## 2018-11-20 DIAGNOSIS — N9419 Other specified dyspareunia: Secondary | ICD-10-CM

## 2018-11-20 DIAGNOSIS — R102 Pelvic and perineal pain: Secondary | ICD-10-CM

## 2018-11-20 LAB — CYTOLOGY - PAP
Adequacy: ABSENT
Diagnosis: NEGATIVE
HPV: NOT DETECTED

## 2018-11-20 LAB — URINE CULTURE: Organism ID, Bacteria: NO GROWTH

## 2018-11-30 ENCOUNTER — Ambulatory Visit (INDEPENDENT_AMBULATORY_CARE_PROVIDER_SITE_OTHER): Payer: 59

## 2018-11-30 ENCOUNTER — Encounter: Payer: Self-pay | Admitting: Obstetrics & Gynecology

## 2018-11-30 ENCOUNTER — Other Ambulatory Visit: Payer: Self-pay

## 2018-11-30 ENCOUNTER — Telehealth: Payer: Self-pay

## 2018-11-30 ENCOUNTER — Ambulatory Visit (INDEPENDENT_AMBULATORY_CARE_PROVIDER_SITE_OTHER): Payer: 59 | Admitting: Obstetrics & Gynecology

## 2018-11-30 VITALS — BP 128/86 | HR 86 | Ht 70.0 in | Wt 180.0 lb

## 2018-11-30 DIAGNOSIS — N9419 Other specified dyspareunia: Secondary | ICD-10-CM | POA: Diagnosis not present

## 2018-11-30 DIAGNOSIS — R102 Pelvic and perineal pain: Secondary | ICD-10-CM

## 2018-11-30 DIAGNOSIS — N8302 Follicular cyst of left ovary: Secondary | ICD-10-CM | POA: Diagnosis not present

## 2018-11-30 DIAGNOSIS — N3281 Overactive bladder: Secondary | ICD-10-CM | POA: Diagnosis not present

## 2018-11-30 MED ORDER — MIRABEGRON ER 25 MG PO TB24: 25.0000 mg | ORAL_TABLET | Freq: Every day | ORAL | 6 refills | Status: DC

## 2018-11-30 NOTE — Patient Instructions (Signed)
Mirabegron extended-release tablets What is this medicine? MIRABEGRON (MIR a BEG ron) is used to treat overactive bladder. This medicine reduces the amount of bathroom visits. It may also help to control wetting accidents. It may be used alone, but sometimes may be given with other treatments. This medicine may be used for other purposes; ask your health care provider or pharmacist if you have questions. COMMON BRAND NAME(S): Myrbetriq What should I tell my health care provider before I take this medicine? They need to know if you have any of these conditions:  high blood pressure  kidney disease  liver disease  problems urinating  prostate disease  an unusual or allergic reaction to mirabegron, other medicines, foods, dyes, or preservatives  pregnant or trying to get pregnant  breast-feeding How should I use this medicine? Take this medicine by mouth with a glass of water. Follow the directions on the prescription label. Do not cut, crush or chew this medicine. You can take it with or without food. If it upsets your stomach, take it with food. Take your medicine at regular intervals. Do not take it more often than directed. Do not stop taking except on your doctor's advice. Talk to your pediatrician regarding the use of this medicine in children. Special care may be needed. Overdosage: If you think you have taken too much of this medicine contact a poison control center or emergency room at once. NOTE: This medicine is only for you. Do not share this medicine with others. What if I miss a dose? If you miss a dose, take it as soon as you can. If it is almost time for your next dose, take only that dose. Do not take double or extra doses. What may interact with this medicine?  codeine  desipramine  digoxin  flecainide  MAOIs like Carbex, Eldepryl, Marplan, Nardil, and Parnate  methadone  metoprolol  pimozide  propafenone  thioridazine  warfarin This list may not  describe all possible interactions. Give your health care provider a list of all the medicines, herbs, non-prescription drugs, or dietary supplements you use. Also tell them if you smoke, drink alcohol, or use illegal drugs. Some items may interact with your medicine. What should I watch for while using this medicine? Visit your doctor or health care professional for regular checks on your progress. Check your blood pressure as directed. Ask your doctor or health care professional what your blood pressure should be and when you should contact him or her. You may need to limit your intake of tea, coffee, caffeinated sodas, or alcohol. These drinks may make your symptoms worse. What side effects may I notice from receiving this medicine? Side effects that you should report to your doctor or health care professional as soon as possible:  allergic reactions like skin rash, itching or hives, swelling of the face, lips, or tongue  high blood pressure  fast, irregular heartbeat  redness, blistering, peeling or loosening of the skin, including inside the mouth  signs of infection like fever or chills; pain or difficulty passing urine  trouble passing urine or change in the amount of urine Side effects that usually do not require medical attention (report to your doctor or health care professional if they continue or are bothersome):  constipation  dry mouth  headache  runny nose  stomach upset This list may not describe all possible side effects. Call your doctor for medical advice about side effects. You may report side effects to FDA at 1-800-FDA-1088. Where should   I keep my medicine? Keep out of the reach of children. Store at room temperature between 15 and 30 degrees C (59 and 86 degrees F). Throw away any unused medicine after the expiration date. NOTE: This sheet is a summary. It may not cover all possible information. If you have questions about this medicine, talk to your doctor,  pharmacist, or health care provider.  2020 Elsevier/Gold Standard (2016-10-10 11:33:21)  

## 2018-11-30 NOTE — Telephone Encounter (Signed)
Pt calling; saw [place] today; was rx med and told if too expensive to let him know and he would send in another medication.  The rx will cost her $375/m.  Can PH call in the other med for her?  [phone]

## 2018-11-30 NOTE — Progress Notes (Signed)
  HPI: Pt has symptoms of pain and bladder urgency/frequency/nocturia with occas incontinence.  Pain realted to sex as well.  Prior tubal surgery.  Ultrasound demonstrates no masses seen These findings are Pelvis normal  PMHx: She  has a past medical history of Anxiety disorder (03/14/2015), Family history of ovarian cancer, GERD (gastroesophageal reflux disease) (03/14/2015), and Scoliosis (03/14/2015). Also,  has a past surgical history that includes Tubal ligation (2008); Tonsillectomy (1991); Partial hysterectomy (2008); and Cesarean section., family history includes Colon cancer in her maternal grandmother; Diabetes in her paternal grandmother; Heart disease in her father, maternal grandfather, and paternal grandfather; Hypertension in her mother; Ovarian cancer in her maternal grandmother; Stroke in her father.,  reports that she has quit smoking. She has never used smokeless tobacco. She reports current alcohol use. She reports that she does not use drugs.  She has a current medication list which includes the following prescription(s): phentermine and mirabegron er. Also, has No Known Allergies.  Review of Systems  All other systems reviewed and are negative.   Objective: BP 128/86   Pulse 86   Ht 5\' 10"  (1.778 m)   Wt 180 lb (81.6 kg)   LMP 11/17/2018 (Exact Date)   BMI 25.83 kg/m   Physical examination Constitutional NAD, Conversant  Skin No rashes, lesions or ulceration.   Extremities: Moves all appropriately.  Normal ROM for age. No lymphadenopathy.  Neuro: Grossly intact  Psych: Oriented to PPT.  Normal mood. Normal affect.   US Pelvis Transvanginal Non-ob (tv Only)  Result Date: 11/30/2018 Patient Name: Gabrielle Parsons DOB: [birth year] MRN: [id number] ULTRASOUND REPORT Location: [facility] Date of Service: 11/30/2018 Indications:Pelvic Pain Findings: The uterus is anteverted and measures 9.2 x 4.0 x 4.7 cm. Echo texture is homogenous without evidence of focal masses. The  Endometrium measures 6.1 mm and appears normal. Right Ovary measures 1.8 x 1.5 x 2.4 cm. It is normal in appearance. Left Ovary measures 2.5 x 2.0 x 1.9 cm. It is normal in appearance. There is a dominant follicle in the left ovary measuring 2.0 x 1.7 x 1.9 cm Survey of the adnexa demonstrates no adnexal masses. There is no free fluid in the cul de sac. Impression: 1. Normal pelvic ultrasound. 2. There is a dominant follicle in the left ovary. Recommendations: 1.Clinical correlation with the patient's History and Physical Exam. Gweneth Dimitri, RT Review of ULTRASOUND.    I have personally reviewed images and report of recent ultrasound done at [facility].    Plan of management to be discussed with patient. Barnett Applebaum, MD, [facility], [facility] 11/30/2018  10:21 AM   Assessment:  Pelvic pain    No cyst or mass by ultrasound Dyspareunia due to medical condition in female    Endometriosis, IC, OAB as potential causes    Laparoscopy as diagnostic next step Overactive bladder     Possible cause for bladder and even pain sx's.  Treatment pros and cons for this discussed; plan 6-12 mos of therapy (if helps) prior to discontinuation w hopes for resolution and not lifetime need for meds.    - Plan: mirabegron ER (MYRBETRIQ) 25 MG TB24 tablet  A total of 15 minutes were spent face-to-face with the patient during this encounter and over half of that time dealt with counseling and coordination of care.  Barnett Applebaum, MD, [facility], [facility] 11/30/2018  10:31 AM

## 2018-12-01 ENCOUNTER — Other Ambulatory Visit: Payer: Self-pay | Admitting: Obstetrics & Gynecology

## 2018-12-01 MED ORDER — TOLTERODINE TARTRATE ER 4 MG PO CP24: 4.0000 mg | ORAL_CAPSULE | Freq: Every day | ORAL | 3 refills | Status: AC

## 2018-12-01 NOTE — Telephone Encounter (Signed)
Pt aware.

## 2018-12-01 NOTE — Telephone Encounter (Signed)
Please advise 

## 2018-12-01 NOTE — Telephone Encounter (Signed)
ERx done [place] once daily

## 2018-12-03 ENCOUNTER — Encounter: Payer: Self-pay | Admitting: Obstetrics and Gynecology

## 2018-12-08 ENCOUNTER — Telehealth: Payer: Self-pay

## 2018-12-08 ENCOUNTER — Other Ambulatory Visit: Payer: Self-pay | Admitting: Obstetrics & Gynecology

## 2018-12-08 NOTE — Telephone Encounter (Signed)
Please advise 

## 2018-12-08 NOTE — Progress Notes (Signed)
Results by Phone: MyRisk neg T-C 10%  FH- Ovarian cancer and colon cancer  Plan- No change in [facility]G protocol, yearly exams too Colonoscopy age 33 Ovarian- no set screening protocol    OK to consider periodic [place] and/or CA125  D/w pt, reassured  Barnett Applebaum, MD, [place], [facility] 12/08/2018  11:16 AM

## 2018-12-08 NOTE — Telephone Encounter (Signed)
Pt calling; she missed a call from [facility] re lab results.  [phone]

## 2019-03-02 ENCOUNTER — Ambulatory Visit: Payer: 59 | Admitting: Obstetrics & Gynecology

## 2019-04-20 NOTE — Telephone Encounter (Signed)
PH spoke c pt. 

## 2020-05-14 ENCOUNTER — Other Ambulatory Visit: Payer: Self-pay

## 2020-05-14 ENCOUNTER — Encounter
Admission: EM | Disposition: A | Discharge status: Home / Self Care | Payer: Self-pay | Source: Home / Self Care | Attending: Specialist

## 2020-05-14 ENCOUNTER — Emergency Department: Payer: 59

## 2020-05-14 ENCOUNTER — Inpatient Hospital Stay
Admission: EM | Admit: 2020-05-14 | Discharge: 2020-05-17 | DRG: 494 | Disposition: A | Discharge status: Home / Self Care | Payer: 59 | Attending: Specialist | Admitting: Specialist

## 2020-05-14 ENCOUNTER — Observation Stay: Payer: 59 | Admitting: Certified Registered"

## 2020-05-14 ENCOUNTER — Observation Stay: Payer: 59

## 2020-05-14 ENCOUNTER — Encounter: Payer: Self-pay | Admitting: Emergency Medicine

## 2020-05-14 DIAGNOSIS — S82232A Displaced oblique fracture of shaft of left tibia, initial encounter for closed fracture: Secondary | ICD-10-CM | POA: Diagnosis not present

## 2020-05-14 DIAGNOSIS — Y92009 Unspecified place in unspecified non-institutional (private) residence as the place of occurrence of the external cause: Secondary | ICD-10-CM

## 2020-05-14 DIAGNOSIS — M419 Scoliosis, unspecified: Secondary | ICD-10-CM | POA: Diagnosis present

## 2020-05-14 DIAGNOSIS — W109XXA Fall (on) (from) unspecified stairs and steps, initial encounter: Secondary | ICD-10-CM | POA: Diagnosis present

## 2020-05-14 DIAGNOSIS — K219 Gastro-esophageal reflux disease without esophagitis: Secondary | ICD-10-CM | POA: Diagnosis present

## 2020-05-14 DIAGNOSIS — S82252A Displaced comminuted fracture of shaft of left tibia, initial encounter for closed fracture: Secondary | ICD-10-CM | POA: Diagnosis present

## 2020-05-14 DIAGNOSIS — Z90711 Acquired absence of uterus with remaining cervical stump: Secondary | ICD-10-CM

## 2020-05-14 DIAGNOSIS — S82202A Unspecified fracture of shaft of left tibia, initial encounter for closed fracture: Secondary | ICD-10-CM

## 2020-05-14 DIAGNOSIS — W19XXXA Unspecified fall, initial encounter: Secondary | ICD-10-CM

## 2020-05-14 DIAGNOSIS — S82209B Unspecified fracture of shaft of unspecified tibia, initial encounter for open fracture type I or II: Secondary | ICD-10-CM

## 2020-05-14 DIAGNOSIS — Z20822 Contact with and (suspected) exposure to covid-19: Secondary | ICD-10-CM | POA: Diagnosis present

## 2020-05-14 HISTORY — PX: ORIF TIBIA FRACTURE: SHX5416

## 2020-05-14 LAB — CBC WITH DIFFERENTIAL/PLATELET
Abs Immature Granulocytes: 0.04 10*3/uL (ref 0.00–0.07)
Basophils Absolute: 0.1 10*3/uL (ref 0.0–0.1)
Basophils Relative: 1 %
Eosinophils Absolute: 0 10*3/uL (ref 0.0–0.5)
Eosinophils Relative: 0 %
HCT: 35.1 % — ABNORMAL LOW (ref 36.0–46.0)
Hemoglobin: 11.8 g/dL — ABNORMAL LOW (ref 12.0–15.0)
Immature Granulocytes: 0 %
Lymphocytes Relative: 18 %
Lymphs Abs: 2 10*3/uL (ref 0.7–4.0)
MCH: 31.2 pg (ref 26.0–34.0)
MCHC: 33.6 g/dL (ref 30.0–36.0)
MCV: 92.9 fL (ref 80.0–100.0)
Monocytes Absolute: 0.7 10*3/uL (ref 0.1–1.0)
Monocytes Relative: 7 %
Neutro Abs: 8.3 10*3/uL — ABNORMAL HIGH (ref 1.7–7.7)
Neutrophils Relative %: 74 %
Platelets: 206 10*3/uL (ref 150–400)
RBC: 3.78 MIL/uL — ABNORMAL LOW (ref 3.87–5.11)
RDW: 12 % (ref 11.5–15.5)
WBC: 11.1 10*3/uL — ABNORMAL HIGH (ref 4.0–10.5)
nRBC: 0 % (ref 0.0–0.2)

## 2020-05-14 LAB — COMPREHENSIVE METABOLIC PANEL
ALT: 17 U/L (ref 0–44)
AST: 18 U/L (ref 15–41)
Albumin: 4.1 g/dL (ref 3.5–5.0)
Alkaline Phosphatase: 44 U/L (ref 38–126)
Anion gap: 10 (ref 5–15)
BUN: 15 mg/dL (ref 6–20)
CO2: 22 mmol/L (ref 22–32)
Calcium: 8.3 mg/dL — ABNORMAL LOW (ref 8.9–10.3)
Chloride: 102 mmol/L (ref 98–111)
Creatinine, Ser: 0.5 mg/dL (ref 0.44–1.00)
GFR, Estimated: >60 mL/min (ref >60)
Glucose, Bld: 96 mg/dL (ref 70–99)
Potassium: 4.1 mmol/L (ref 3.5–5.1)
Sodium: 134 mmol/L — ABNORMAL LOW (ref 135–145)
Total Bilirubin: 0.7 mg/dL (ref 0.3–1.2)
Total Protein: 7 g/dL (ref 6.5–8.1)

## 2020-05-14 LAB — RESP PANEL BY RT-PCR (FLU A&B, COVID) ARPGX2
Influenza A by PCR: NEGATIVE
Influenza B by PCR: NEGATIVE
SARS Coronavirus 2 by RT PCR: NEGATIVE

## 2020-05-14 LAB — TYPE AND SCREEN
ABO/RH(D): O NEG
Antibody Screen: NEGATIVE

## 2020-05-14 LAB — HCG, QUANTITATIVE, PREGNANCY: hCG, Beta Chain, Quant, S: <1 m[IU]/mL (ref <5)

## 2020-05-14 LAB — PROTIME-INR
INR: 1 (ref 0.8–1.2)
Prothrombin Time: 12.8 seconds (ref 11.4–15.2)

## 2020-05-14 SURGERY — OPEN REDUCTION INTERNAL FIXATION (ORIF) TIBIA FRACTURE
Anesthesia: General | Laterality: Left

## 2020-05-14 MED ORDER — ONDANSETRON HCL 4 MG/2ML IJ SOLN
INTRAMUSCULAR | Status: AC
  Filled 2020-05-14: qty 2

## 2020-05-14 MED ORDER — DEXAMETHASONE SODIUM PHOSPHATE 10 MG/ML IJ SOLN
INTRAMUSCULAR | Status: AC
  Filled 2020-05-14: qty 1

## 2020-05-14 MED ORDER — TRAMADOL HCL 50 MG PO TABS
ORAL_TABLET | ORAL | Status: AC
  Filled 2020-05-14: qty 1

## 2020-05-14 MED ORDER — NEOMYCIN-POLYMYXIN B GU 40-200000 IR SOLN
Status: AC
  Filled 2020-05-14: qty 1

## 2020-05-14 MED ORDER — CLINDAMYCIN PHOSPHATE 600 MG/50ML IV SOLN
INTRAVENOUS | Status: AC
  Filled 2020-05-14: qty 50

## 2020-05-14 MED ORDER — CLINDAMYCIN PHOSPHATE 600 MG/50ML IV SOLN
600.0000 mg | INTRAVENOUS | Status: AC
  Administered 2020-05-14: 600 mg via INTRAVENOUS
  Filled 2020-05-14: qty 50

## 2020-05-14 MED ORDER — HYDROMORPHONE HCL 1 MG/ML IJ SOLN
INTRAMUSCULAR | Status: AC
  Administered 2020-05-14: 0.5 mg via INTRAVENOUS
  Filled 2020-05-14: qty 1

## 2020-05-14 MED ORDER — LIDOCAINE HCL (CARDIAC) PF 100 MG/5ML IV SOSY
PREFILLED_SYRINGE | INTRAVENOUS | Status: DC | PRN
  Administered 2020-05-14: 100 mg via INTRAVENOUS

## 2020-05-14 MED ORDER — FENTANYL CITRATE (PF) 100 MCG/2ML IJ SOLN
INTRAMUSCULAR | Status: AC
  Administered 2020-05-14: 25 ug via INTRAVENOUS
  Filled 2020-05-14: qty 2

## 2020-05-14 MED ORDER — FENTANYL CITRATE (PF) 100 MCG/2ML IJ SOLN
INTRAMUSCULAR | Status: DC | PRN
  Administered 2020-05-14 (×2): 50 ug via INTRAVENOUS
  Administered 2020-05-14: 100 ug via INTRAVENOUS
  Administered 2020-05-14: 50 ug via INTRAVENOUS

## 2020-05-14 MED ORDER — GABAPENTIN 300 MG PO CAPS
ORAL_CAPSULE | ORAL | Status: AC
  Filled 2020-05-14: qty 1

## 2020-05-14 MED ORDER — SUGAMMADEX SODIUM 200 MG/2ML IV SOLN
INTRAVENOUS | Status: DC | PRN
  Administered 2020-05-14: 200 mg via INTRAVENOUS

## 2020-05-14 MED ORDER — MIDAZOLAM HCL 2 MG/2ML IJ SOLN
INTRAMUSCULAR | Status: DC | PRN
  Administered 2020-05-14: 2 mg via INTRAVENOUS

## 2020-05-14 MED ORDER — GABAPENTIN 300 MG PO CAPS
300.0000 mg | ORAL_CAPSULE | Freq: Three times a day (TID) | ORAL | Status: DC
  Administered 2020-05-14 – 2020-05-17 (×8): 300 mg via ORAL
  Filled 2020-05-14 (×7): qty 1

## 2020-05-14 MED ORDER — CEFAZOLIN SODIUM-DEXTROSE 2-4 GM/100ML-% IV SOLN
2.0000 g | INTRAVENOUS | Status: AC
  Administered 2020-05-14: 2 g via INTRAVENOUS
  Filled 2020-05-14: qty 100

## 2020-05-14 MED ORDER — ONDANSETRON HCL 4 MG/2ML IJ SOLN
4.0000 mg | Freq: Once | INTRAMUSCULAR | Status: AC | PRN
  Administered 2020-05-14: 4 mg via INTRAVENOUS

## 2020-05-14 MED ORDER — MIDAZOLAM HCL 2 MG/2ML IJ SOLN
INTRAMUSCULAR | Status: AC
  Filled 2020-05-14: qty 2

## 2020-05-14 MED ORDER — ACETAMINOPHEN 325 MG PO TABS
650.0000 mg | ORAL_TABLET | Freq: Once | ORAL | Status: AC
  Administered 2020-05-14: 650 mg via ORAL
  Filled 2020-05-14: qty 2

## 2020-05-14 MED ORDER — BUPIVACAINE HCL (PF) 0.25 % IJ SOLN
INTRAMUSCULAR | Status: AC
  Filled 2020-05-14: qty 30

## 2020-05-14 MED ORDER — MORPHINE SULFATE (PF) 4 MG/ML IV SOLN
4.0000 mg | Freq: Once | INTRAVENOUS | Status: AC
  Administered 2020-05-14: 4 mg via INTRAVENOUS
  Filled 2020-05-14: qty 1

## 2020-05-14 MED ORDER — FENTANYL CITRATE (PF) 100 MCG/2ML IJ SOLN
25.0000 ug | INTRAMUSCULAR | Status: AC | PRN
Start: 2020-05-14 — End: 2020-05-14
  Administered 2020-05-14 (×6): 25 ug via INTRAVENOUS

## 2020-05-14 MED ORDER — DEXAMETHASONE SODIUM PHOSPHATE 10 MG/ML IJ SOLN
INTRAMUSCULAR | Status: DC | PRN
  Administered 2020-05-14: 10 mg via INTRAVENOUS

## 2020-05-14 MED ORDER — MORPHINE SULFATE (PF) 4 MG/ML IV SOLN
INTRAVENOUS | Status: AC
  Administered 2020-05-14: 1 mg via INTRAVENOUS
  Filled 2020-05-14: qty 1

## 2020-05-14 MED ORDER — OXYCODONE-ACETAMINOPHEN 5-325 MG PO TABS
1.0000 | ORAL_TABLET | ORAL | Status: DC | PRN
  Administered 2020-05-14: 1 via ORAL
  Filled 2020-05-14: qty 1

## 2020-05-14 MED ORDER — MORPHINE SULFATE (PF) 2 MG/ML IV SOLN
1.0000 mg | INTRAVENOUS | Status: DC | PRN
Start: 2020-05-14 — End: 2020-05-15
  Administered 2020-05-14 (×3): 1 mg via INTRAVENOUS
  Filled 2020-05-14 (×3): qty 1

## 2020-05-14 MED ORDER — SODIUM CHLORIDE 0.9 % IV SOLN: INTRAVENOUS | Status: DC

## 2020-05-14 MED ORDER — LACTATED RINGERS IV SOLN: INTRAVENOUS | Status: DC | PRN

## 2020-05-14 MED ORDER — ROCURONIUM BROMIDE 100 MG/10ML IV SOLN
INTRAVENOUS | Status: DC | PRN
  Administered 2020-05-14: 60 mg via INTRAVENOUS

## 2020-05-14 MED ORDER — CEFAZOLIN SODIUM 1 G IJ SOLR
INTRAMUSCULAR | Status: AC
  Filled 2020-05-14: qty 20

## 2020-05-14 MED ORDER — HYDROMORPHONE HCL 1 MG/ML IJ SOLN
0.5000 mg | INTRAMUSCULAR | Status: AC | PRN
Start: 2020-05-14 — End: 2020-05-15
  Administered 2020-05-14 – 2020-05-15 (×2): 0.5 mg via INTRAVENOUS

## 2020-05-14 MED ORDER — DEXMEDETOMIDINE (PRECEDEX) IN NS 20 MCG/5ML (4 MCG/ML) IV SYRINGE
PREFILLED_SYRINGE | INTRAVENOUS | Status: AC
  Filled 2020-05-14: qty 5

## 2020-05-14 MED ORDER — FENTANYL CITRATE (PF) 250 MCG/5ML IJ SOLN
INTRAMUSCULAR | Status: AC
  Filled 2020-05-14: qty 5

## 2020-05-14 MED ORDER — PROPOFOL 10 MG/ML IV BOLUS
INTRAVENOUS | Status: DC | PRN
  Administered 2020-05-14: 200 mg via INTRAVENOUS
  Administered 2020-05-14: 60 mg via INTRAVENOUS

## 2020-05-14 MED ORDER — MORPHINE SULFATE (PF) 2 MG/ML IV SOLN
1.0000 mg | Freq: Once | INTRAVENOUS | Status: AC
  Administered 2020-05-14: 1 mg via INTRAVENOUS
  Filled 2020-05-14: qty 1

## 2020-05-14 MED ORDER — TRAMADOL HCL 50 MG PO TABS
50.0000 mg | ORAL_TABLET | Freq: Four times a day (QID) | ORAL | Status: DC
Start: 2020-05-15 — End: 2020-05-15
  Administered 2020-05-14 – 2020-05-15 (×3): 50 mg via ORAL
  Filled 2020-05-14 (×3): qty 1

## 2020-05-14 MED ORDER — DEXMEDETOMIDINE (PRECEDEX) IN NS 20 MCG/5ML (4 MCG/ML) IV SYRINGE
PREFILLED_SYRINGE | INTRAVENOUS | Status: DC | PRN
  Administered 2020-05-14: 8 ug via INTRAVENOUS
  Administered 2020-05-14: 12 ug via INTRAVENOUS

## 2020-05-14 MED ORDER — OXYCODONE HCL 5 MG PO TABS: 5.0000 mg | ORAL_TABLET | Freq: Once | ORAL | Status: DC

## 2020-05-14 MED ORDER — ONDANSETRON HCL 4 MG/2ML IJ SOLN
INTRAMUSCULAR | Status: DC | PRN
  Administered 2020-05-14: 4 mg via INTRAVENOUS

## 2020-05-14 SURGICAL SUPPLY — 46 items
BIT DRILL 3.8X6 NS (BIT) ×1 IMPLANT
BIT DRILL 4.4 NS (BIT) ×1 IMPLANT
CANISTER SUCT 1200ML W/VALVE (MISCELLANEOUS) ×2 IMPLANT
CHLORAPREP W/TINT 26 (MISCELLANEOUS) ×2 IMPLANT
COVER WAND RF STERILE (DRAPES) ×2 IMPLANT
CUFF TOURN SGL QUICK 24 (TOURNIQUET CUFF)
CUFF TOURN SGL QUICK 30 (TOURNIQUET CUFF)
CUFF TRNQT CYL 24X4X16.5-23 (TOURNIQUET CUFF) IMPLANT
CUFF TRNQT CYL 30X4X21-28X (TOURNIQUET CUFF) IMPLANT
DRAPE C-ARM XRAY 36X54 (DRAPES) ×2 IMPLANT
DRAPE C-ARMOR (DRAPES) ×1 IMPLANT
DRAPE U-SHAPE 47X51 STRL (DRAPES) ×2 IMPLANT
DRSG AQUACEL AG ADV 3.5X 4 (GAUZE/BANDAGES/DRESSINGS) ×2 IMPLANT
DRSG AQUACEL AG ADV 3.5X10 (GAUZE/BANDAGES/DRESSINGS) ×2 IMPLANT
ELECT REM PT RETURN 9FT ADLT (ELECTROSURGICAL) ×2
ELECTRODE REM PT RTRN 9FT ADLT (ELECTROSURGICAL) ×1 IMPLANT
GAUZE SPONGE 4X4 12PLY STRL (GAUZE/BANDAGES/DRESSINGS) ×2 IMPLANT
GAUZE XEROFORM 1X8 LF (GAUZE/BANDAGES/DRESSINGS) ×2 IMPLANT
GLOVE INDICATOR 8.0 STRL GRN (GLOVE) ×2 IMPLANT
GLOVE SURG ORTHO 8.5 STRL (GLOVE) ×2 IMPLANT
GLOVE SURG XRAY 8.0 LX (GLOVE) ×2 IMPLANT
GOWN STRL REUS W/ TWL LRG LVL3 (GOWN DISPOSABLE) ×1 IMPLANT
GOWN STRL REUS W/TWL LRG LVL3 (GOWN DISPOSABLE) ×1
GOWN STRL REUS W/TWL LRG LVL4 (GOWN DISPOSABLE) ×2 IMPLANT
GUIDEPIN 3.2X17.5 THRD DISP (PIN) ×1 IMPLANT
GUIDEWIRE BALL NOSE 100CM (WIRE) ×2 IMPLANT
GUIDEWIRE BALL NOSE 80CM (WIRE) ×1 IMPLANT
HEMOVAC 400CC 10FR (MISCELLANEOUS) ×2 IMPLANT
KIT TURNOVER KIT A (KITS) ×2 IMPLANT
MANIFOLD NEPTUNE II (INSTRUMENTS) ×2 IMPLANT
NAIL IM TIBIAL 10X34.5 (Orthopedic Implant) IMPLANT
NDL SPNL 18GX3.5 QUINCKE PK (NEEDLE) ×1 IMPLANT
NEEDLE SPNL 18GX3.5 QUINCKE PK (NEEDLE) ×2 IMPLANT
NS IRRIG 1000ML POUR BTL (IV SOLUTION) ×2 IMPLANT
PACK TOTAL KNEE (MISCELLANEOUS) ×2 IMPLANT
PAD ABD DERMACEA PRESS 5X9 (GAUZE/BANDAGES/DRESSINGS) ×2 IMPLANT
SCREW ACECAP 46MM (Screw) ×1 IMPLANT
SCREW PROXIMAL DEPUY (Screw) ×1 IMPLANT
SCREW PRXML FT 40X5.5XNS LF (Screw) IMPLANT
SPONGE LAP 18X18 RF (DISPOSABLE) ×2 IMPLANT
STAPLER SKIN PROX 35W (STAPLE) ×2 IMPLANT
SUT VIC AB 0 CT1 36 (SUTURE) ×4 IMPLANT
SUT VIC AB 2-0 CT1 27 (SUTURE) ×2
SUT VIC AB 2-0 CT1 TAPERPNT 27 (SUTURE) ×2 IMPLANT
SYR 10ML LL (SYRINGE) ×2 IMPLANT
TIBIAL NAIL 10X34.5 (Orthopedic Implant) ×2 IMPLANT

## 2020-05-14 NOTE — H&P (Signed)
THE PATIENT WAS SEEN PRIOR TO SURGERY TODAY.  HISTORY, ALLERGIES, HOME MEDICATIONS AND OPERATIVE PROCEDURE WERE REVIEWED. RISKS AND BENEFITS OF SURGERY DISCUSSED WITH PATIENT AGAIN.  NO CHANGES FROM INITIAL HISTORY AND PHYSICAL NOTED.    

## 2020-05-14 NOTE — H&P (Signed)
PREOPERATIVE H&P  Chief Complaint: TIBIAL FRACTURE  HPI: Gabrielle Parsons is a 34 y.o. female who fell on her steps at home last night.  She tried to rest for a while but became became pain became worse and she presented to the emergency room.  Exam and x-rays show a displaced left tibial shaft fracture.  The fibular appears intact.  Patient was advised that he should undergo operative fixation of the fracture versus cast immobilization.  The risks and benefits of wound and pitfalls of each were discussed. She has elected for surgical management.  We will plan to do surgery later today.  Past Medical History:  Diagnosis Date  . Anxiety disorder 03/14/2015  . BRCA negative 11/2018   MyRisk neg; IBIS=10.1%/riskscore=13.9%  . Family history of ovarian cancer 11/2018   MyRisk neg  . GERD (gastroesophageal reflux disease) 03/14/2015  . Scoliosis 03/14/2015   Past Surgical History:  Procedure Laterality Date  . CESAREAN SECTION     2006, 2013  . PARTIAL HYSTERECTOMY  2008  . TONSILLECTOMY  1991  . TUBAL LIGATION  2008   Social History   Socioeconomic History  . Marital status: Married    Spouse name: Not on file  . Number of children: Not on file  . Years of education: Not on file  . Highest education level: Not on file  Occupational History  . Not on file  Tobacco Use  . Smoking status: Former Research scientist (life sciences)  . Smokeless tobacco: Never Used  Vaping Use  . Vaping Use: Never used  Substance and Sexual Activity  . Alcohol use: Yes    Alcohol/week: 0.0 standard drinks  . Drug use: No  . Sexual activity: Yes  Other Topics Concern  . Not on file  Social History Narrative  . Not on file   Social Determinants of Health   Financial Resource Strain: Not on file  Food Insecurity: Not on file  Transportation Needs: Not on file  Physical Activity: Not on file  Stress: Not on file  Social Connections: Not on file   Family History  Problem Relation Age of Onset  . Colon cancer Maternal  Grandmother   . Ovarian cancer Maternal Grandmother   . Hypertension Mother   . Stroke Father   . Heart disease Father   . Heart disease Paternal Grandfather   . Heart disease Maternal Grandfather   . Diabetes Paternal Grandmother    No Known Allergies Prior to Admission medications   Medication Sig Start Date End Date Taking? Authorizing Provider  Multiple Vitamin (MULTIVITAMIN WITH MINERALS) TABS tablet Take 1 tablet by mouth daily.   Yes [provider]     Positive ROS: All other systems have been reviewed and were otherwise negative with the exception of those mentioned in the HPI and as above.  Physical Exam: General: Alert, no acute distress Cardiovascular: No pedal edema. Heart is regular and without murmur.  Respiratory: No cyanosis, no use of accessory musculature. Lungs are clear. GI: No organomegaly, abdomen is soft and non-tender Skin: No lesions in the area of chief complaint Neurologic: Sensation intact distally Psychiatric: Patient is competent for consent with normal mood and affect Lymphatic: No axillary or cervical lymphadenopathy  MUSCULOSKELETAL: Left leg is tender and slightly swollen at the lower tibial region.  The skin is intact.  Neurovascular status is good.  Hip is unremarkable.  No other orthopedic injuries are noted.  There are no abrasions.  Assessment: TIBIAL FRACTURE left-displaced short spiral fracture distal one  third  Plan: Plan for Procedure(s): OPEN REDUCTION INTERNAL FIXATION (ORIF) TIBIA FRACTURE(ROD)  The risks benefits and alternatives were discussed with the patient including but not limited to the risks of nonoperative treatment, versus surgical intervention including infection, bleeding, nerve injury,  blood clots, cardiopulmonary complications, morbidity, mortality, among others, and they were willing to proceed.   Park Breed, MD [phone]   05/14/2020 12:47 PM

## 2020-05-14 NOTE — Anesthesia Preprocedure Evaluation (Signed)
Anesthesia Evaluation  Patient identified by MRN, date of birth, ID band Patient awake    Reviewed: Allergy & Precautions, NPO status , Patient's Chart, lab work & pertinent test results  History of Anesthesia Complications (+) PONV and history of anesthetic complications  Airway Mallampati: II       Dental   Pulmonary neg sleep apnea, neg COPD, Not current smoker, former smoker,           Cardiovascular (-) hypertension(-) Past MI and (-) CHF (-) dysrhythmias (-) Valvular Problems/Murmurs     Neuro/Psych neg Seizures Anxiety    GI/Hepatic Neg liver ROS, GERD  ,  Endo/Other  neg diabetes  Renal/GU negative Renal ROS     Musculoskeletal   Abdominal   Peds  Hematology   Anesthesia Other Findings   Reproductive/Obstetrics                             Anesthesia Physical Anesthesia Plan  ASA: II  Anesthesia Plan: General   Post-op Pain Management:    Induction: Intravenous  PONV Risk Score and Plan: 4 or greater and Ondansetron, Dexamethasone, Midazolam and Propofol infusion  Airway Management Planned: Oral ETT  Additional Equipment:   Intra-op Plan:   Post-operative Plan:   Informed Consent: I have reviewed the patients History and Physical, chart, labs and discussed the procedure including the risks, benefits and alternatives for the proposed anesthesia with the patient or authorized representative who has indicated his/her understanding and acceptance.       Plan Discussed with:   Anesthesia Plan Comments:         Anesthesia Quick Evaluation

## 2020-05-14 NOTE — ED Triage Notes (Signed)
Pt to ED via POV, pt states that around 0200 she fell down the steps and hurt her left leg. Pt has swelling and bruising to her left leg. Color is WNL and pt has strong pedal pulse. Pt denies hitting her head when she fell. Pt denies pain any where else. Pt is in NAD.

## 2020-05-14 NOTE — ED Provider Notes (Addendum)
[facility] Emergency Department Provider Note   ____________________________________________   None    (approximate)  I have reviewed the triage vital signs and the nursing notes.   HISTORY  Chief Complaint Leg Pain   HPI Gabrielle Parsons is a 34 y.o. female .Presents to the ED with complaint of left lower leg pain.  Patient states that she was coming down the steps of her home At approximately 2 AM.  Patient states that she fell coming down the steps which were wood and carpet.  Patient denies any head injury or LOC.  She reports that she was able to get back to bed and sleep for approximately 1 hour before increased pain.  Patient has continued to have swelling and bruising to her left leg since that time.  She rates her pain as a 4 out of 10 at this time.       Past Medical History:  Diagnosis Date   Anxiety disorder 03/14/2015   BRCA negative 11/2018   MyRisk neg; IBIS=10.1%/riskscore=13.9%   Family history of ovarian cancer 11/2018   MyRisk neg   GERD (gastroesophageal reflux disease) 03/14/2015   Scoliosis 03/14/2015    Patient Active Problem List   Diagnosis Date Noted   Anxiety disorder 03/14/2015   GERD (gastroesophageal reflux disease) 03/14/2015   Scoliosis 03/14/2015    Past Surgical History:  Procedure Laterality Date   CESAREAN SECTION     2006, 2013   PARTIAL HYSTERECTOMY  2008   [place]  2008    Prior to Admission medications   Medication Sig Start Date End Date Taking? Authorizing Provider  phentermine (ADIPEX-P) 37.5 MG tablet Take 37.5 mg by mouth daily before breakfast.    [provider]    Allergies Patient has no known allergies.  Family History  Problem Relation Age of Onset   Colon cancer Maternal Grandmother    Ovarian cancer Maternal Grandmother    Hypertension Mother    Stroke Father    Heart disease Father    Heart disease Paternal Grandfather     Heart disease Maternal Grandfather    Diabetes Paternal Grandmother     Social History Social History   Tobacco Use   Smoking status: Former Smoker   Smokeless tobacco: Never Used  Scientific laboratory technician Use: Never used  Substance Use Topics   Alcohol use: Yes    Alcohol/week: 0.0 standard drinks   Drug use: No    Review of Systems Constitutional: No fever/chills Eyes: No visual changes. ENT: No trauma. Cardiovascular: Denies chest pain. Respiratory: Denies shortness of breath. Gastrointestinal: No abdominal pain.  No nausea, no vomiting.  Musculoskeletal: Positive for left leg pain.  Negative for back pain. Skin: Positive for bruising. Neurological: Negative for headaches, focal weakness or numbness. ____________________________________________   PHYSICAL EXAM:  VITAL SIGNS: ED Triage Vitals [05/14/20 0707]  Enc Vitals Group     BP 126/81     Pulse Rate (!) 118     Resp 16     Temp 99 F (37.2 C)     Temp Source Oral     SpO2 97 %     Weight 175 lb (79.4 kg)     Height $Remov'5\' 10"'KbgGoT$  (1.778 m)     Head Circumference      Peak Flow      Pain Score 4     Pain Loc      Pain Edu?  Excl. in [place]?     Constitutional: Alert and oriented. Well appearing and in no acute distress. Eyes: Conjunctivae are normal. PERRL. EOMI. Head: Atraumatic. Nose: No trauma. Mouth/Throat: No trauma. Neck: No stridor.  No cervical tenderness palpation posteriorly. Cardiovascular: Normal rate, regular rhythm. Grossly normal heart sounds.  Good peripheral circulation. Respiratory: Normal respiratory effort.  No retractions. Lungs CTAB. Gastrointestinal: Soft and nontender. No distention.  Musculoskeletal: No tenderness on palpation of the thoracic or lumbar spine.  Patient is able move upper extremities without any difficulty.  On examination of the left lower extremity there is moderate swelling with ecchymosis present approximately midshaft tib-fib.  Pulses are intact both DP and  TP.  Motor sensory function intact.  No effusion is noted around the patella and no tenderness on palpation of the femur. Neurologic:  Normal speech and language. No gross focal neurologic deficits are appreciated.  Skin:  Skin is warm, dry and intact.  Ecchymosis and edema secondary to injury as mentioned above. Psychiatric: Mood and affect are normal. Speech and behavior are normal.  ____________________________________________   LABS (all labs ordered are listed, but only abnormal results are displayed)  Labs Reviewed  RESP PANEL BY RT-PCR (FLU A&B, COVID) [id number]  CBC WITH DIFFERENTIAL/PLATELET  COMPREHENSIVE METABOLIC PANEL     RADIOLOGY I, Johnn Hai, personally viewed and evaluated these images (plain radiographs) as part of my medical decision making, as well as reviewing the written report by the radiologist.  Official radiology report(s): DG Tibia/Fibula Left  Result Date: 05/14/2020 CLINICAL DATA:  Left leg pain after fall down steps. EXAM: LEFT TIBIA AND FIBULA - 2 VIEW COMPARISON:  None. FINDINGS: Moderately displaced and comminuted fracture is seen involving the distal left tibial shaft. The fibula is unremarkable. IMPRESSION: Moderately displaced and comminuted distal left tibial shaft fracture. Electronically Signed   By: Marijo Conception M.D.   On: 05/14/2020 07:59    ____________________________________________   PROCEDURES  Procedure(s) performed (including Critical Care):  Procedures   ____________________________________________   INITIAL IMPRESSION / ASSESSMENT AND PLAN / ED COURSE  As part of my medical decision making, I reviewed the following data within the electronic MEDICAL RECORD NUMBER Notes from prior ED visits and [place] Controlled Substance Database  34 year old female presents to the ED with complaint of left lower leg pain after she fell at 2 AM this morning in her home going down some steps.  No other injuries per patient.  There was no head  injury or LOC.  Physical exam is suspicious for a fracture and x-ray shows a comminuted displaced  tibial shaft fracture.  Dr. Sabra Heck who is on-call for orthopedics was consulted and also spoke to the patient by phone.  After discussion and talking with her husband, patient elected to have surgery which is planned for later this afternoon.  Initial basic orders were entered by this provider and Dr. Sabra Heck will enter presurgical orders.  ____________________________________________   FINAL CLINICAL IMPRESSION(S) / ED DIAGNOSES  Final diagnoses:  Closed displaced comminuted fracture of shaft of left tibia, initial encounter     ED Discharge Orders    None      *Please note:  MC HOLLEN was evaluated in Emergency Department on 05/14/2020 for the symptoms described in the history of present illness. She was evaluated in the context of the global COVID-19 pandemic, which necessitated consideration that the patient might be at risk for infection with the SARS-CoV-2 virus that causes COVID-19. Institutional protocols and algorithms that  pertain to the evaluation of patients at risk for COVID-19 are in a state of rapid change based on information released by regulatory bodies including the CDC and federal and state organizations. These policies and algorithms were followed during the patient's care in the ED.  Some ED evaluations and interventions may be delayed as a result of limited staffing during and the pandemic.*   Note:  This document was prepared using Dragon voice recognition software and may include unintentional dictation errors.    Johnn Hai, PA-C 05/14/20 0927    Johnn Hai, PA-C 05/14/20 1157    Vladimir Crofts, MD 05/14/20 1025

## 2020-05-14 NOTE — Anesthesia Postprocedure Evaluation (Signed)
Anesthesia Post Note  Patient: Gabrielle Parsons  Procedure(s) Performed: OPEN REDUCTION INTERNAL FIXATION (ORIF) TIBIA FRACTURE(ROD) (Left )  Patient location during evaluation: PACU Anesthesia Type: General Level of consciousness: awake and alert Pain management: pain level controlled Vital Signs Assessment: post-procedure vital signs reviewed and stable Respiratory status: spontaneous breathing and respiratory function stable Cardiovascular status: stable Anesthetic complications: no   No complications documented.   Last Vitals:  Vitals:   05/14/20 2327 05/14/20 2330  BP: 129/82   Pulse: 100 (!) 102  Resp: (!) 8 16  Temp:    SpO2: 96% 100%    Last Pain:  Vitals:   05/14/20 2330  TempSrc:   PainSc: 7                  Odarius Dines K

## 2020-05-14 NOTE — Op Note (Signed)
Expand All Collapse All     05/14/2020  10:39 PM  PATIENT:  Gabrielle Parsons   MRN: [id number]  PRE-OPERATIVE DIAGNOSIS: Left tibial shaft fracture, short oblique with displacement  POST-OPERATIVE DIAGNOSIS: Same  PROCEDURE:  Procedure(s): OPEN REDUCTION INTERNAL FIXATION (ORIF) TIBIA FRACTURE(ROD)  SURGEON:  Valinda Hoar, MD  ANESTHESIA:   General endotracheal PREOPERATIVE INDICATIONS:  The patient  has a diagnosis of displaced and unstable tibia/ fibula fractures.  She elected for surgical management after discussion with the patient   about the options between surgery and cast management of the fractures. .  The risks benefits and alternatives were discussed with the patient preoperatively including but not limited to the risks of infection, bleeding, nerve injury, cardiopulmonary complications, the need for revision surgery, among others, and the patient was willing to proceed.  OPERATIVE IMPLANTS: Biomet Versanail,   345 mm    10 mm  OPERATIVE FINDINGS: Short oblique displaced left tibia fracture distal third  COMPLICATIONS: None  EBL: Minimal      REPLACED: None  OPERATIVE PROCEDURE: The patient was brought to the operating room and underwent spinal anesthesia without complications and then placed on the operating room table and positioned appropriately.  The operative leg was prepped and draped in a sterile fashion.  Tourniquet was not used.  IV anti-biotics were given.  The leg was placed on the tibial reduction triangle and the foot immobilized with Coban and traction and rotational corrections were made.  A proximal medial incision was made along the patellar tendon.  Dissection was carried out bluntly through subcutaneous tissue and the fascia was divided.  A guidepin was introduced into the proximal tibia under fluoroscopic control and seen to be in good alignment and position.  Large drill was introduced to open the the proximal tibia.  A guidepin was then passed down  the shaft of the tibia and across the fracture site down to the lower end of the tibia.  The shaft was then sequentially reamed to 11.5 mm.  The above listed Versanail was introduced and passed across the fracture site and fully seated in the tibia.  Fluoroscopy showed excellent position of the nail and that the fracture had been well reduced.  Length was excellent.  Proximally, fixation was obtained with 1 5.5 screw(s).   Fluoroscopy showed good position and length.  Distally, 1 screw was introduced and seated fully. Fluoroscopy showed good position and length.  The wounds were then irrigated.  The knee fascia was closed with 2-0 Vicryls and the subcutaneous tissue was closed with 3-0 Vicryls.  All wounds were closed with staples.  Xeroform covered by dressing sponges and cast padding were applied. Sponge and needle counts were correct.   A well-padded posterior splint was applied.  The patient was transferred to the hospital bed and taken to recovery room in good condition.  Valinda Hoar, MD

## 2020-05-14 NOTE — Anesthesia Procedure Notes (Signed)
Procedure Name: Intubation Date/Time: 05/14/2020 8:55 PM Performed by: Katherine Basset, CRNA Pre-anesthesia Checklist: Patient identified, Emergency Drugs available, Suction available and Patient being monitored Patient Re-evaluated:Patient Re-evaluated prior to induction Oxygen Delivery Method: Circle system utilized Preoxygenation: Pre-oxygenation with 100% oxygen Induction Type: IV induction Ventilation: Mask ventilation without difficulty Laryngoscope Size: Miller and 2 Grade View: Grade I Tube type: Oral Tube size: 7.5 mm Number of attempts: 1 Airway Equipment and Method: Stylet and Oral airway Placement Confirmation: ETT inserted through vocal cords under direct vision,  positive ETCO2 and breath sounds checked- equal and bilateral Secured at: 21 cm Tube secured with: Tape Dental Injury: Teeth and Oropharynx as per pre-operative assessment

## 2020-05-14 NOTE — Transfer of Care (Signed)
Immediate Anesthesia Transfer of Care Note  Patient: Gabrielle Parsons  Procedure(s) Performed: OPEN REDUCTION INTERNAL FIXATION (ORIF) TIBIA FRACTURE(ROD) (Left )  Patient Location: PACU  Anesthesia Type:General  Level of Consciousness: awake, alert  and oriented  Airway & Oxygen Therapy: Patient Spontanous Breathing  Post-op Assessment: Report given to RN and Post -op Vital signs reviewed and stable  Post vital signs: Reviewed and stable  Last Vitals:  Vitals Value Taken Time  BP 117/56 05/14/20 2242  Temp 36 C 05/14/20 2242  Pulse 98 05/14/20 2245  Resp 20 05/14/20 2245  SpO2 100 % 05/14/20 2245  Vitals shown include unvalidated device data.  Last Pain:  Vitals:   05/14/20 2242  TempSrc:   PainSc: Asleep         Complications: No complications documented.

## 2020-05-15 ENCOUNTER — Encounter: Payer: Self-pay | Admitting: Specialist

## 2020-05-15 LAB — CBC
HCT: 34.5 % — ABNORMAL LOW (ref 36.0–46.0)
Hemoglobin: 11.5 g/dL — ABNORMAL LOW (ref 12.0–15.0)
MCH: 31.3 pg (ref 26.0–34.0)
MCHC: 33.3 g/dL (ref 30.0–36.0)
MCV: 93.8 fL (ref 80.0–100.0)
Platelets: 204 10*3/uL (ref 150–400)
RBC: 3.68 MIL/uL — ABNORMAL LOW (ref 3.87–5.11)
RDW: 11.9 % (ref 11.5–15.5)
WBC: 9.9 10*3/uL (ref 4.0–10.5)
nRBC: 0 % (ref 0.0–0.2)

## 2020-05-15 LAB — CREATININE, SERUM
Creatinine, Ser: 0.65 mg/dL (ref 0.44–1.00)
GFR, Estimated: >60 mL/min (ref >60)

## 2020-05-15 MED ORDER — METOCLOPRAMIDE HCL 5 MG/ML IJ SOLN
10.0000 mg | Freq: Once | INTRAMUSCULAR | Status: AC
  Administered 2020-05-15: 10 mg via INTRAVENOUS

## 2020-05-15 MED ORDER — TRAMADOL HCL 50 MG PO TABS: 50.0000 mg | ORAL_TABLET | Freq: Four times a day (QID) | ORAL | Status: DC

## 2020-05-15 MED ORDER — CEFAZOLIN SODIUM-DEXTROSE 2-4 GM/100ML-% IV SOLN
2.0000 g | Freq: Three times a day (TID) | INTRAVENOUS | Status: AC
  Administered 2020-05-15 (×3): 2 g via INTRAVENOUS
  Filled 2020-05-15 (×4): qty 100

## 2020-05-15 MED ORDER — METOCLOPRAMIDE HCL 10 MG PO TABS: 5.0000 mg | ORAL_TABLET | Freq: Three times a day (TID) | ORAL | Status: DC | PRN

## 2020-05-15 MED ORDER — ENOXAPARIN SODIUM 30 MG/0.3ML ~~LOC~~ SOLN
30.0000 mg | SUBCUTANEOUS | Status: DC
  Administered 2020-05-15: 30 mg via SUBCUTANEOUS
  Filled 2020-05-15: qty 0.3

## 2020-05-15 MED ORDER — ADULT MULTIVITAMIN W/MINERALS CH
1.0000 | ORAL_TABLET | Freq: Every day | ORAL | Status: DC
  Administered 2020-05-15 – 2020-05-17 (×3): 1 via ORAL
  Filled 2020-05-15 (×3): qty 1

## 2020-05-15 MED ORDER — ONDANSETRON HCL 4 MG PO TABS: 4.0000 mg | ORAL_TABLET | Freq: Four times a day (QID) | ORAL | Status: DC | PRN

## 2020-05-15 MED ORDER — HYDROCODONE-ACETAMINOPHEN 7.5-325 MG PO TABS
1.0000 | ORAL_TABLET | ORAL | Status: DC | PRN
Start: 2020-05-15 — End: 2020-05-16

## 2020-05-15 MED ORDER — KETOROLAC TROMETHAMINE 30 MG/ML IJ SOLN
INTRAMUSCULAR | Status: AC
  Administered 2020-05-15: 30 mg via INTRAVENOUS
  Filled 2020-05-15: qty 1

## 2020-05-15 MED ORDER — FLEET ENEMA 7-19 GM/118ML RE ENEM: 1.0000 | ENEMA | Freq: Once | RECTAL | Status: DC | PRN

## 2020-05-15 MED ORDER — CLINDAMYCIN PHOSPHATE 600 MG/50ML IV SOLN
600.0000 mg | Freq: Three times a day (TID) | INTRAVENOUS | Status: AC
  Administered 2020-05-15 (×3): 600 mg via INTRAVENOUS
  Filled 2020-05-15 (×4): qty 50

## 2020-05-15 MED ORDER — METOCLOPRAMIDE HCL 5 MG/ML IJ SOLN: 5.0000 mg | Freq: Three times a day (TID) | INTRAMUSCULAR | Status: DC | PRN

## 2020-05-15 MED ORDER — HYDROCODONE-ACETAMINOPHEN 5-325 MG PO TABS: 1.0000 | ORAL_TABLET | ORAL | Status: DC | PRN

## 2020-05-15 MED ORDER — METOCLOPRAMIDE HCL 5 MG/ML IJ SOLN
INTRAMUSCULAR | Status: AC
  Filled 2020-05-15: qty 2

## 2020-05-15 MED ORDER — MORPHINE SULFATE (PF) 2 MG/ML IV SOLN
0.5000 mg | INTRAVENOUS | Status: DC | PRN
Start: 2020-05-15 — End: 2020-05-17
  Administered 2020-05-15 (×3): 1 mg via INTRAVENOUS
  Administered 2020-05-15 (×3): 0.5 mg via INTRAVENOUS
  Administered 2020-05-15 – 2020-05-16 (×4): 1 mg via INTRAVENOUS
  Administered 2020-05-16: 0.5 mg via INTRAVENOUS
  Administered 2020-05-16 (×2): 1 mg via INTRAVENOUS
  Filled 2020-05-15 (×13): qty 1

## 2020-05-15 MED ORDER — TRAMADOL HCL 50 MG PO TABS: 50.0000 mg | ORAL_TABLET | Freq: Four times a day (QID) | ORAL | Status: DC | PRN

## 2020-05-15 MED ORDER — ENOXAPARIN SODIUM 40 MG/0.4ML ~~LOC~~ SOLN
40.0000 mg | SUBCUTANEOUS | Status: DC
  Administered 2020-05-16 – 2020-05-17 (×2): 40 mg via SUBCUTANEOUS
  Filled 2020-05-15 (×2): qty 0.4

## 2020-05-15 MED ORDER — DIPHENHYDRAMINE HCL 50 MG/ML IJ SOLN
INTRAMUSCULAR | Status: AC
  Filled 2020-05-15: qty 1

## 2020-05-15 MED ORDER — ACETAMINOPHEN 325 MG PO TABS: 325.0000 mg | ORAL_TABLET | Freq: Four times a day (QID) | ORAL | Status: DC | PRN

## 2020-05-15 MED ORDER — DOCUSATE SODIUM 100 MG PO CAPS
100.0000 mg | ORAL_CAPSULE | Freq: Two times a day (BID) | ORAL | Status: DC
  Administered 2020-05-15 – 2020-05-17 (×6): 100 mg via ORAL
  Filled 2020-05-15 (×5): qty 1

## 2020-05-15 MED ORDER — METHOCARBAMOL 1000 MG/10ML IJ SOLN
500.0000 mg | Freq: Four times a day (QID) | INTRAVENOUS | Status: DC | PRN
  Filled 2020-05-15: qty 5

## 2020-05-15 MED ORDER — MAGNESIUM HYDROXIDE 400 MG/5ML PO SUSP
30.0000 mL | Freq: Every day | ORAL | Status: DC | PRN
Start: 2020-05-15 — End: 2020-05-17
  Administered 2020-05-15 – 2020-05-16 (×2): 30 mL via ORAL
  Filled 2020-05-15 (×2): qty 30

## 2020-05-15 MED ORDER — METHOCARBAMOL 500 MG PO TABS
500.0000 mg | ORAL_TABLET | Freq: Four times a day (QID) | ORAL | Status: DC | PRN
  Administered 2020-05-15 (×2): 500 mg via ORAL
  Filled 2020-05-15 (×2): qty 1

## 2020-05-15 MED ORDER — BISACODYL 10 MG RE SUPP
10.0000 mg | Freq: Every day | RECTAL | Status: DC | PRN
  Administered 2020-05-17: 10 mg via RECTAL
  Filled 2020-05-15: qty 1

## 2020-05-15 MED ORDER — DIPHENHYDRAMINE HCL 50 MG/ML IJ SOLN
25.0000 mg | Freq: Once | INTRAMUSCULAR | Status: AC
  Administered 2020-05-15: 25 mg via INTRAVENOUS

## 2020-05-15 MED ORDER — TRAMADOL HCL 50 MG PO TABS
50.0000 mg | ORAL_TABLET | Freq: Four times a day (QID) | ORAL | Status: DC
  Administered 2020-05-15 – 2020-05-17 (×8): 50 mg via ORAL
  Filled 2020-05-15 (×8): qty 1

## 2020-05-15 MED ORDER — KETOROLAC TROMETHAMINE 30 MG/ML IJ SOLN: 30.0000 mg | Freq: Once | INTRAMUSCULAR | Status: AC

## 2020-05-15 MED ORDER — SODIUM CHLORIDE 0.45 % IV SOLN: INTRAVENOUS | Status: DC

## 2020-05-15 MED ORDER — ONDANSETRON HCL 4 MG/2ML IJ SOLN: 4.0000 mg | Freq: Four times a day (QID) | INTRAMUSCULAR | Status: DC | PRN

## 2020-05-15 NOTE — Progress Notes (Signed)
Subjective: 1 Day Post-Op Procedure(s) (LRB): OPEN REDUCTION INTERNAL FIXATION (ORIF) TIBIA FRACTURE(ROD) (Left)   Patient is out of bed in a chair and doing well.  Pain is moderate.  Her husband is present. She is starting PT.  Patient reports pain as moderate.  Objective:   VITALS:   Vitals:   05/15/20 1105 05/15/20 1535  BP: 115/80 120/74  Pulse: 89 88  Resp: 16 18  Temp: 98.2 F (36.8 C) 98.1 F (36.7 C)  SpO2: 99% 100%    Neurologically intact Sensation intact distally Dorsiflexion/Plantar flexion intact Incision: dressing C/D/I  LABS Recent Labs    05/14/20 1120 05/15/20 0547  HGB 11.8* 11.5*  HCT 35.1* 34.5*  WBC 11.1* 9.9  PLT 206 204    Recent Labs    05/14/20 1120 05/15/20 0547  NA 134*  --   K 4.1  --   BUN 15  --   CREATININE 0.50 0.65  GLUCOSE 96  --     Recent Labs    05/14/20 1034  INR 1.0     Assessment/Plan: 1 Day Post-Op Procedure(s) (LRB): OPEN REDUCTION INTERNAL FIXATION (ORIF) TIBIA FRACTURE(ROD) (Left)   Advance diet Up with therapy D/C IV fluids Discharge home with home health   We will discharge aspirin twice a day She will remain nonweightbearing.

## 2020-05-15 NOTE — TOC Initial Note (Signed)
Transition of Care [facility]) - Initial/Assessment Note    Patient Details  Name: Gabrielle Parsons MRN: [id number] Date of Birth: 04/10/1986  [facility] CM/SW Contact:    Shelbie Ammons, RN Phone Number: 05/15/2020, 1:57 PM  Clinical Narrative:     RNCM met with patient and husband in room. Patient is having significant pain and nurse was notified about this and is paging MD. Patient is verbalizing agreement with recommendations for rolling walker and 3N1. She is also agreeable to OPPT and requests that it be through [facility].  RNCM reached out to [facility] and they will be ready to arrange therapy once ordered by MD. Patient notified that she will need to call to schedule appt when she gets home.  RNCM reached out to [facility] with [employer] and she will arrange for rolling walker and 3N1.              Expected Discharge Plan: OP Rehab Barriers to Discharge: No Barriers Identified   Patient Goals and CMS Choice        Expected Discharge Plan and Services Expected Discharge Plan: OP Rehab       Living arrangements for the past 2 months: Single Family Home                 DME Arranged: 3-N-1,Walker rolling DME Agency: [employer]                  Prior Living Arrangements/Services Living arrangements for the past 2 months: Single Family Home Lives with:: Spouse Patient language and need for interpreter reviewed:: Yes Do you feel safe going back to the place where you live?: Yes      Need for Family Participation in Patient Care: Yes (Comment) Care giver support system in place?: Yes (comment)   Criminal Activity/Legal Involvement Pertinent to Current Situation/Hospitalization: No - Comment as needed  Activities of Daily Living Home Assistive Devices/Equipment: None ADL Screening (condition at time of admission) Patient's cognitive ability adequate to safely complete daily activities?: Yes Is the patient deaf or have difficulty hearing?: No Does the patient have  difficulty seeing, even when wearing glasses/contacts?: No Does the patient have difficulty concentrating, remembering, or making decisions?: No Patient able to express need for assistance with ADLs?: Yes Does the patient have difficulty dressing or bathing?: No Independently performs ADLs?: Yes (appropriate for developmental age) Does the patient have difficulty walking or climbing stairs?: No Weakness of Legs: None Weakness of Arms/Hands: None  Permission Sought/Granted                  Emotional Assessment Appearance:: Appears stated age Attitude/Demeanor/Rapport: Crying Affect (typically observed): Appropriate Orientation: : Oriented to Self,Oriented to Place,Oriented to  Time,Oriented to Situation Alcohol / Substance Use: Not Applicable Psych Involvement: No (comment)  Admission diagnosis:  Closed displaced comminuted fracture of shaft of left tibia, initial encounter [S82.252A] Displaced comminuted fracture of shaft of left tibia, initial encounter for closed fracture [S82.252A] Patient Active Problem List   Diagnosis Date Noted  . Displaced comminuted fracture of shaft of left tibia, initial encounter for closed fracture 05/14/2020  . Anxiety disorder 03/14/2015  . GERD (gastroesophageal reflux disease) 03/14/2015  . Scoliosis 03/14/2015   PCP:  Lorelee Market, MD Pharmacy:   [id number] - [place], [address] [place] [address] Phone: [phone] Fax: [phone]  [id number] - [place], [address] [place] [address] Phone: [phone] Fax: [phone]  [id number] - [place], [place] - [place] AT [place] [place] [address] Phone: [phone] Fax: [phone]     Social Determinants of Health (SDOH) Interventions    Readmission Risk Interventions No flowsheet data found.

## 2020-05-15 NOTE — Evaluation (Signed)
Physical Therapy Evaluation Patient Details Name: Gabrielle Parsons MRN: [id number] DOB: [birth year] Today's Date: 05/15/2020   History of Present Illness  Pt admitted for L tibia fx secondary to fall down stairs. She is now s/p ORIF. Pt with history including anxiety, GERD, and scoliosis.  Clinical Impression  Pt is a pleasant 34 year old female who was admitted for L tibia fx s/p ORIF. Pt is reporting severe pain although medicated prior to evaluation. Pt performs bed mobility, transfers, and ambulation with cga and B crutches. Pt demonstrates deficits with pain/mobility/endurance. Will need to perform stair training prior to discharge. Would benefit from skilled PT to address above deficits and promote optimal return to PLOF. Currently recommending outpatient rehab once medically cleared to participate.  Follow Up Recommendations Outpatient PT    Equipment Recommendations  3in1 (PT);Crutches    Recommendations for Other Services       Precautions / Restrictions Precautions Precautions: Fall Restrictions Weight Bearing Restrictions: Yes LLE Weight Bearing: Non weight bearing      Mobility  Bed Mobility Overal bed mobility: Needs Assistance Bed Mobility: Supine to Sit     Supine to sit: Min guard     General bed mobility comments: needs assist for lowering L LE down to floor    Transfers Overall transfer level: Needs assistance Equipment used: Crutches Transfers: Sit to/from Stand Sit to Stand: Min assist;Min guard         General transfer comment: demonstration prior to performance. Upon initial standing, slight unsteadiness noted. Able to maintain correct WBing status without cues  Ambulation/Gait Ambulation/Gait assistance: Min guard Gait Distance (Feet): 5 Feet Assistive device: Crutches Gait Pattern/deviations: Step-to pattern     General Gait Details: ambulated over to recliner chair with safe technique. Further distance deferred secondary to  pain.  Stairs            Wheelchair Mobility    Modified Rankin (Stroke Patients Only)       Balance Overall balance assessment: Needs assistance Sitting-balance support: Bilateral upper extremity supported;Feet supported Sitting balance-Leahy Scale: Normal     Standing balance support: Bilateral upper extremity supported Standing balance-Leahy Scale: Fair                               Pertinent Vitals/Pain Pain Assessment: 0-10 Pain Score: 7  Pain Location: L LE Pain Descriptors / Indicators: Operative site guarding Pain Intervention(s): Limited activity within patient's tolerance;Repositioned;Premedicated before session    Home Living Family/patient expects to be discharged to:: Private residence Living Arrangements: Spouse/significant other;Children Available Help at Discharge: Family;Available 24 hours/day Type of Home: House Home Access: Stairs to enter Entrance Stairs-Rails: Can reach both Entrance Stairs-Number of Steps: 4 Home Layout: Two level;Able to live on main level with bedroom/bathroom Home Equipment: None      Prior Function Level of Independence: Independent               Hand Dominance        Extremity/Trunk Assessment   Upper Extremity Assessment Upper Extremity Assessment: Overall WFL for tasks assessed    Lower Extremity Assessment Lower Extremity Assessment: Overall WFL for tasks assessed (limited by pain)       Communication   Communication: No difficulties  Cognition Arousal/Alertness: Awake/alert Behavior During Therapy: WFL for tasks assessed/performed Overall Cognitive Status: Within Functional Limits for tasks assessed  General Comments      Exercises     Assessment/Plan    PT Assessment Patient needs continued PT services  PT Problem List Decreased balance;Decreased mobility;Decreased knowledge of use of DME;Pain       PT Treatment  Interventions DME instruction;Gait training;Stair training;Therapeutic exercise;Balance training    PT Goals (Current goals can be found in the Care Plan section)  Acute Rehab PT Goals Patient Stated Goal: to go home PT Goal Formulation: With patient Time For Goal Achievement: 05/29/20 Potential to Achieve Goals: Good    Frequency 7X/week   Barriers to discharge        Co-evaluation               AM-PAC PT "6 Clicks" Mobility  Outcome Measure Help needed turning from your back to your side while in a flat bed without using bedrails?: None Help needed moving from lying on your back to sitting on the side of a flat bed without using bedrails?: A Little Help needed moving to and from a bed to a chair (including a wheelchair)?: A Little Help needed standing up from a chair using your arms (e.g., wheelchair or bedside chair)?: A Little Help needed to walk in hospital room?: A Little Help needed climbing 3-5 steps with a railing? : A Lot 6 Click Score: 18    End of Session Equipment Utilized During Treatment: Gait belt Activity Tolerance: Patient limited by pain Patient left: in chair;with SCD's reapplied Nurse Communication: Mobility status PT Visit Diagnosis: Unsteadiness on feet (R26.81);History of falling (Z91.81);Difficulty in walking, not elsewhere classified (R26.2);Pain Pain - Right/Left: Left Pain - part of body: Leg    Time: 9381-8299 PT Time Calculation (min) (ACUTE ONLY): 24 min   Charges:   PT Evaluation $PT Eval Low Complexity: 1 Low PT Treatments $Gait Training: 8-22 mins        Elizabeth Palau, PT, DPT [phone]   [name] 05/15/2020, 12:55 PM

## 2020-05-15 NOTE — Progress Notes (Signed)
PHARMACIST - PHYSICIAN COMMUNICATION  CONCERNING:  Enoxaparin (Lovenox) for DVT Prophylaxis    RECOMMENDATION: Patient was prescribed enoxaprin 30mg  q24 hours for VTE prophylaxis.   Filed Weights   05/14/20 0707  Weight: 79.4 kg (175 lb)    Body mass index is 25.11 kg/m.  Estimated Creatinine Clearance: 108.2 mL/min (by C-G formula based on SCr of 0.65 mg/dL).   Based on [facility] policy patient is candidate for enoxaparin 40mg  every 24 hours based on CrCl >78ml/min or Weight >45kg  DESCRIPTION: Pharmacy has adjusted enoxaparin dose per [facility] policy.  Patient is now receiving enoxaparin 40 mg every 24 hours   31m, PharmD, BCPS Clinical Pharmacist 05/15/2020 9:38 AM

## 2020-05-16 ENCOUNTER — Observation Stay: Payer: 59

## 2020-05-16 DIAGNOSIS — M419 Scoliosis, unspecified: Secondary | ICD-10-CM | POA: Diagnosis present

## 2020-05-16 DIAGNOSIS — K219 Gastro-esophageal reflux disease without esophagitis: Secondary | ICD-10-CM | POA: Diagnosis present

## 2020-05-16 DIAGNOSIS — S82232A Displaced oblique fracture of shaft of left tibia, initial encounter for closed fracture: Secondary | ICD-10-CM | POA: Diagnosis present

## 2020-05-16 DIAGNOSIS — W109XXA Fall (on) (from) unspecified stairs and steps, initial encounter: Secondary | ICD-10-CM | POA: Diagnosis present

## 2020-05-16 DIAGNOSIS — Z90711 Acquired absence of uterus with remaining cervical stump: Secondary | ICD-10-CM | POA: Diagnosis not present

## 2020-05-16 DIAGNOSIS — S82252A Displaced comminuted fracture of shaft of left tibia, initial encounter for closed fracture: Secondary | ICD-10-CM | POA: Diagnosis present

## 2020-05-16 DIAGNOSIS — Y92009 Unspecified place in unspecified non-institutional (private) residence as the place of occurrence of the external cause: Secondary | ICD-10-CM | POA: Diagnosis not present

## 2020-05-16 DIAGNOSIS — Z20822 Contact with and (suspected) exposure to covid-19: Secondary | ICD-10-CM | POA: Diagnosis present

## 2020-05-16 MED ORDER — PHENAZOPYRIDINE HCL 100 MG PO TABS
100.0000 mg | ORAL_TABLET | Freq: Three times a day (TID) | ORAL | Status: DC | PRN
  Administered 2020-05-16: 100 mg via ORAL
  Filled 2020-05-16 (×2): qty 1

## 2020-05-16 MED ORDER — OXYCODONE HCL 5 MG PO TABS
5.0000 mg | ORAL_TABLET | ORAL | Status: DC | PRN
  Administered 2020-05-16 – 2020-05-17 (×7): 5 mg via ORAL
  Filled 2020-05-16 (×7): qty 1

## 2020-05-16 MED ORDER — MELOXICAM 7.5 MG PO TABS
15.0000 mg | ORAL_TABLET | Freq: Every day | ORAL | Status: DC
  Administered 2020-05-16 – 2020-05-17 (×2): 15 mg via ORAL
  Filled 2020-05-16 (×2): qty 2

## 2020-05-16 NOTE — Progress Notes (Signed)
Subjective: 2 Days Post-Op Procedure(s) (LRB): OPEN REDUCTION INTERNAL FIXATION (ORIF) TIBIA FRACTURE(ROD) (Left) Patient fell out of bed last night and complains of increased left leg pain from knee to ankle.  Portable x-rays were done this morning which show no change in position of fracture fragments or the hardware.  Maintains excellent alignment and position.  Complains of pain with movement of the knee or just lifting the leg.  Move the toes minimally.  She is required larger doses of pain medication as well.  Patient reports pain as moderate.  Objective:   VITALS:   Vitals:   05/16/20 0827 05/16/20 1126  BP: 113/72 122/77  Pulse: 95 89  Resp: 16 16  Temp: 98.6 F (37 C) 99.4 F (37.4 C)  SpO2: 100% 100%    Neurologically intact Sensation intact distally Dorsiflexion/Plantar flexion intact Incision: dressing C/D/I  LABS Recent Labs    05/14/20 1120 05/15/20 0547  HGB 11.8* 11.5*  HCT 35.1* 34.5*  WBC 11.1* 9.9  PLT 206 204    Recent Labs    05/14/20 1120 05/15/20 0547  NA 134*  --   K 4.1  --   BUN 15  --   CREATININE 0.50 0.65  GLUCOSE 96  --     Recent Labs    05/14/20 1034  INR 1.0     Assessment/Plan: 2 Days Post-Op Procedure(s) (LRB): OPEN REDUCTION INTERNAL FIXATION (ORIF) TIBIA FRACTURE(ROD) (Left)   Advance diet Up with therapy Plan for discharge tomorrow home.  Encourage patient to move leg in the more aggressively.  She is fearful of causing more pain but needs to be more aggressive she was advised. Continue PT nonweightbearing left leg Discharge home tomorrow with home health PT if needed.

## 2020-05-16 NOTE — Plan of Care (Signed)

## 2020-05-16 NOTE — Progress Notes (Deleted)
Pt complains of burning sensation when voiding and urine leak and will like to be checked for UTI. Will cont to monitor pt.

## 2020-05-16 NOTE — Progress Notes (Signed)
Physical Therapy Treatment Patient Details Name: Gabrielle Parsons MRN: [id number] DOB: 1985-08-08 Today's Date: 05/16/2020    History of Present Illness Pt admitted for L tibia fx secondary to fall down stairs. She is now s/p ORIF. Pt with history including anxiety, GERD, and scoliosis.    PT Comments    Pt is making good progress towards goals. Is frustrated/limited by pain this date. Reports she rolled out of bed into floor, imaging clear. Able to perform supine there-ex. Ambulation performed with RW, improved safety. Able to ambulate in hallway short distances able to maintain correct WBing status. Stair training performed, able to scoot up on buttock and demonstrate safe technique. Will continue to progress.   Follow Up Recommendations  Outpatient PT     Equipment Recommendations  3in1 (PT);Rolling walker with 5" wheels    Recommendations for Other Services       Precautions / Restrictions Precautions Precautions: Fall Restrictions Weight Bearing Restrictions: Yes LLE Weight Bearing: Non weight bearing    Mobility  Bed Mobility Overal bed mobility: Needs Assistance Bed Mobility: Supine to Sit     Supine to sit: Supervision     General bed mobility comments: improved technique. Once sitting, upright posture noted  Transfers Overall transfer level: Needs assistance Equipment used: Rolling walker (2 wheeled) Transfers: Sit to/from Stand Sit to Stand: Min guard         General transfer comment: safe technique  Ambulation/Gait Ambulation/Gait assistance: Supervision Gait Distance (Feet): 50 Feet Assistive device: Rolling walker (2 wheeled) Gait Pattern/deviations: Step-to pattern     General Gait Details: able to maintain NWB status. Improved technique and safety. Fatigues quickly   Stairs Stairs: Yes Stairs assistance: Min assist Stair Management: Seated/boosting Number of Stairs: 4 General stair comments: Demonstrated prior to performance. Safe  technique. Will have husband support at home   Wheelchair Mobility    Modified Rankin (Stroke Patients Only)       Balance Overall balance assessment: Needs assistance Sitting-balance support: Bilateral upper extremity supported;Feet supported Sitting balance-Leahy Scale: Normal     Standing balance support: Bilateral upper extremity supported Standing balance-Leahy Scale: Fair                              Cognition Arousal/Alertness: Awake/alert Behavior During Therapy: WFL for tasks assessed/performed Overall Cognitive Status: Within Functional Limits for tasks assessed                                        Exercises Other Exercises Other Exercises: supine ther-ex performed on B LE including SLRs, hip abd/add, quad sets, and glut sets. All ther-ex performed x 10 reps with cga    General Comments        Pertinent Vitals/Pain Pain Assessment: 0-10 Pain Score: 7  Pain Location: L LE Pain Descriptors / Indicators: Operative site guarding Pain Intervention(s): Limited activity within patient's tolerance;Repositioned;Premedicated before session    Home Living                      Prior Function            PT Goals (current goals can now be found in the care plan section) Acute Rehab PT Goals Patient Stated Goal: to go home PT Goal Formulation: With patient Time For Goal Achievement: 05/29/20 Potential to Achieve Goals: Good Progress towards  PT goals: Progressing toward goals    Frequency    7X/week      PT Plan Current plan remains appropriate    Co-evaluation              AM-PAC PT "6 Clicks" Mobility   Outcome Measure  Help needed turning from your back to your side while in a flat bed without using bedrails?: None Help needed moving from lying on your back to sitting on the side of a flat bed without using bedrails?: A Little Help needed moving to and from a bed to a chair (including a wheelchair)?: A  Little Help needed standing up from a chair using your arms (e.g., wheelchair or bedside chair)?: A Little Help needed to walk in hospital room?: A Little Help needed climbing 3-5 steps with a railing? : A Little 6 Click Score: 19    End of Session Equipment Utilized During Treatment: Gait belt Activity Tolerance: Patient limited by pain Patient left: in chair;with SCD's reapplied;with chair alarm set Nurse Communication: Mobility status PT Visit Diagnosis: Unsteadiness on feet (R26.81);History of falling (Z91.81);Difficulty in walking, not elsewhere classified (R26.2);Pain Pain - Right/Left: Left Pain - part of body: Leg     Time: 5956-3875 PT Time Calculation (min) (ACUTE ONLY): 39 min  Charges:  $Gait Training: 23-37 mins $Therapeutic Exercise: 8-22 mins                     Gabrielle Parsons, PT, DPT [phone]    Gabrielle Parsons 05/16/2020, 4:34 PM

## 2020-05-16 NOTE — Progress Notes (Signed)
PT Cancellation Note  Patient Details Name: Gabrielle Parsons MRN: [id number] DOB: 05-24-1986   Cancelled Treatment:    Reason Eval/Treat Not Completed: Other (comment). Treatment attempted, however pt in severe pain. She reports she fell out of bed during the night and just had imaging done. Will hold at this time and re-attempt in PM.   Diangelo Radel 05/16/2020, 10:12 AM  Elizabeth Palau, PT, DPT [phone]

## 2020-05-17 MED ORDER — MELOXICAM 15 MG PO TABS: 15.0000 mg | ORAL_TABLET | Freq: Every day | ORAL | 3 refills | Status: DC

## 2020-05-17 MED ORDER — GABAPENTIN 400 MG PO CAPS: 400.0000 mg | ORAL_CAPSULE | Freq: Two times a day (BID) | ORAL | 3 refills | Status: DC

## 2020-05-17 MED ORDER — OXYCODONE HCL 5 MG PO TABS: 5.0000 mg | ORAL_TABLET | Freq: Four times a day (QID) | ORAL | 0 refills | Status: DC | PRN

## 2020-05-17 MED ORDER — SULFAMETHOXAZOLE-TRIMETHOPRIM 800-160 MG PO TABS: 1.0000 | ORAL_TABLET | Freq: Two times a day (BID) | ORAL | 3 refills | Status: DC

## 2020-05-17 MED ORDER — ASPIRIN EC 325 MG PO TBEC: 325.0000 mg | DELAYED_RELEASE_TABLET | Freq: Every day | ORAL | 0 refills | Status: DC

## 2020-05-17 NOTE — Discharge Summary (Signed)
Physician Discharge Summary  Patient ID: Gabrielle Parsons MRN: [id number] DOB/AGE: 04-Sep-1985 34 y.o.  Admit date: 05/14/2020 Discharge date: 05/17/2020  Admission Diagnoses: Displaced left tibial shaft fracture  Discharge Diagnoses: Same Active Problems:   Displaced comminuted fracture of shaft of left tibia, initial encounter for closed fracture   Discharged Condition: good  Hospital Course: The patient underwent left tibial rodding for displaced fracture of the tibial shaft on Sunday, 05/14/2020.  She had significant pain for 48 hours but is doing better now.  She is ready for discharge to home and has been getting physical therapy.  Consults: None  Significant Diagnostic Studies: radiology: X-Ray: Left tibia pre and postop  Treatments: IV hydration and antibiotics: Ancef  Discharge Exam: Blood pressure 124/72, pulse 80, temperature 98.7 F (37.1 C), resp. rate 16, height 5\' 10" (1.778 m), weight 79.4 kg, last menstrual period 04/24/2020, SpO2 99 %. Dressing is dry.  Neurovascular status is good.  Pain is better controlled.  No significant swelling distally.  Disposition: Discharge disposition: 01-Home or Self Care       Discharge Instructions    Call MD for:  persistant nausea and vomiting   Complete by: As directed    Call MD for:  redness, tenderness, or signs of infection (pain, swelling, redness, odor or green/yellow discharge around incision site)   Complete by: As directed    Call MD for:  severe uncontrolled pain   Complete by: As directed    Call MD for:  temperature >100.4   Complete by: As directed    Diet - low sodium heart healthy   Complete by: As directed    Discharge instructions   Complete by: As directed    Keep left leg elevated at all times Take buffered aspirin twice a day Ice left leg Use walker, no weight on left leg   Increase activity slowly   Complete by: As directed    No wound care   Complete by: As directed    Non weight bearing    Complete by: As directed    Laterality: left   Extremity: Lower     Allergies as of 05/17/2020   No Known Allergies     Medication List    TAKE these medications   aspirin EC 325 MG tablet Take 1 tablet (325 mg total) by mouth daily.   gabapentin 400 MG capsule Commonly known as: Neurontin Take 1 capsule (400 mg total) by mouth 2 (two) times daily.   meloxicam 15 MG tablet Commonly known as: MOBIC Take 1 tablet (15 mg total) by mouth daily.   multivitamin with minerals Tabs tablet Take 1 tablet by mouth daily.   oxyCODONE 5 MG immediate release tablet Commonly known as: Roxicodone Take 1 tablet (5 mg total) by mouth every 6 (six) hours as needed for severe pain.   sulfamethoxazole-trimethoprim 800-160 MG tablet Commonly known as: BACTRIM DS Take 1 tablet by mouth 2 (two) times daily.            Durable Medical Equipment  (From admission, onward)         Start     Ordered   05/15/20 1355  For home use only DME 3 n 1  Once        12 /13/21 1354   05/15/20 0127  DME Walker rolling  Once       Question Answer Comment  Walker: With 5 Inch Wheels   Patient needs a walker to treat with the following condition Closed  left tibial fracture      05/15/20 0126           Discharge Care Instructions  (From admission, onward)         Start     Ordered   05/17/20 0000  Non weight bearing       Question Answer Comment  Laterality left   Extremity Lower      05/17/20 1426          Follow-up Information    Deeann Saint, MD. Schedule an appointment as soon as possible for a visit in 5 day(s).   Specialty: Orthopedic Surgery Why: Please make appointment for this patient prior to discharge Contact information: [address] [place] [address] [phone]               Signed: Valinda Hoar 05/17/2020, 2:28 PM

## 2020-05-17 NOTE — Plan of Care (Signed)

## 2020-05-17 NOTE — Progress Notes (Signed)
Discharge note:  Pt discharged home today. Discharge instructions provided to patient. Verbalized understanding. IV removed. Patient sent home with walker and bed side commode. Driven home by mother. Wheeled to car by staff.  Arlana Hove, RN

## 2020-05-17 NOTE — Progress Notes (Signed)
Subjective: 3 Days Post-Op Procedure(s) (LRB): OPEN REDUCTION INTERNAL FIXATION (ORIF) TIBIA FRACTURE(ROD) (Left)   The patient is doing better today.  Pain is better controlled orally.  Think she can go home today.  Feels as though she might be getting a UTI so we will get a urine culture.  Patient reports pain as moderate.  Objective:   VITALS:   Vitals:   05/17/20 0836 05/17/20 1308  BP: (!) 109/58 124/72  Pulse: 76 80  Resp: 19 16  Temp: (!) 97.4 F (36.3 C) 98.7 F (37.1 C)  SpO2: 100% 99%    Neurologically intact Sensation intact distally Dorsiflexion/Plantar flexion intact Incision: dressing C/D/I  LABS Recent Labs    05/15/20 0547  HGB 11.5*  HCT 34.5*  WBC 9.9  PLT 204    Recent Labs    05/15/20 0547  CREATININE 0.65    No results for input(s): LABPT, INR in the last 72 hours.   Assessment/Plan: 3 Days Post-Op Procedure(s) (LRB): OPEN REDUCTION INTERNAL FIXATION (ORIF) TIBIA FRACTURE(ROD) (Left)   Discharged today a with walker. Nonweightbearing left leg. Enteric-coated aspirin twice a day. Return to clinic 5 days for x-ray and dressing change Septra DS twice daily to cover any urinary tract infection

## 2020-05-17 NOTE — Progress Notes (Signed)
Physical Therapy Treatment Patient Details Name: Gabrielle Parsons MRN: [id number] DOB: [birth year] Today's Date: 05/17/2020    History of Present Illness Pt admitted for L tibia fx secondary to fall down stairs. She is now s/p ORIF. Pt with history including anxiety, GERD, and scoliosis.    PT Comments    Pt is making good progress and is hopeful for discharge today. Is eating breakfast at time of arrival and states she plans to get up to recliner later. Agreeable to there-ex. Pain improved today. CM updated on progress. Has all equipment. Will continue to progress as able.   Follow Up Recommendations  Outpatient PT     Equipment Recommendations  3in1 (PT);Rolling walker with 5" wheels    Recommendations for Other Services       Precautions / Restrictions Precautions Precautions: Fall Restrictions Weight Bearing Restrictions: Yes LLE Weight Bearing: Non weight bearing    Mobility  Bed Mobility               General bed mobility comments: Pt eating breakfast and defers activity. States she plans to get up after breakfast with RN staff  Transfers                    Ambulation/Gait                 Stairs             Wheelchair Mobility    Modified Rankin (Stroke Patients Only)       Balance                                            Cognition Arousal/Alertness: Awake/alert Behavior During Therapy: WFL for tasks assessed/performed Overall Cognitive Status: Within Functional Limits for tasks assessed                                        Exercises Other Exercises Other Exercises: agreeable to supine ther-ex including B LE hip add squeezes, SLR, quad sets, and hip abd/add. All ther-ex performed x 12 reps with safe technique    General Comments        Pertinent Vitals/Pain Pain Assessment: 0-10 Pain Score: 5  Pain Location: L LE Pain Descriptors / Indicators: Operative site guarding Pain  Intervention(s): Limited activity within patient's tolerance;Premedicated before session    Home Living                      Prior Function            PT Goals (current goals can now be found in the care plan section) Acute Rehab PT Goals Patient Stated Goal: to go home PT Goal Formulation: With patient Time For Goal Achievement: 05/29/20 Potential to Achieve Goals: Good Progress towards PT goals: Progressing toward goals    Frequency    7X/week      PT Plan Current plan remains appropriate    Co-evaluation              AM-PAC PT "6 Clicks" Mobility   Outcome Measure  Help needed turning from your back to your side while in a flat bed without using bedrails?: None Help needed moving from lying on your back to sitting on the side of a flat  bed without using bedrails?: A Little Help needed moving to and from a bed to a chair (including a wheelchair)?: A Little Help needed standing up from a chair using your arms (e.g., wheelchair or bedside chair)?: A Little Help needed to walk in hospital room?: A Little Help needed climbing 3-5 steps with a railing? : A Little 6 Click Score: 19    End of Session   Activity Tolerance: Patient limited by pain Patient left: in bed;with bed alarm set;with SCD's reapplied Nurse Communication: Mobility status PT Visit Diagnosis: Unsteadiness on feet (R26.81);History of falling (Z91.81);Difficulty in walking, not elsewhere classified (R26.2);Pain Pain - Right/Left: Left Pain - part of body: Leg     Time: 2947-6546 PT Time Calculation (min) (ACUTE ONLY): 12 min  Charges:  $Therapeutic Exercise: 8-22 mins                     Elizabeth Palau, PT, DPT [phone]    Letcher Schweikert 05/17/2020, 10:19 AM

## 2020-05-18 LAB — URINE CULTURE: Culture: NO GROWTH

## 2021-01-21 ENCOUNTER — Emergency Department: Payer: 59

## 2021-01-21 ENCOUNTER — Encounter: Payer: Self-pay | Admitting: Emergency Medicine

## 2021-01-21 ENCOUNTER — Other Ambulatory Visit: Payer: Self-pay

## 2021-01-21 ENCOUNTER — Emergency Department
Admission: EM | Admit: 2021-01-21 | Discharge: 2021-01-21 | Disposition: A | Discharge status: Home / Self Care | Payer: 59 | Attending: Emergency Medicine | Admitting: Emergency Medicine

## 2021-01-21 DIAGNOSIS — W01110A Fall on same level from slipping, tripping and stumbling with subsequent striking against sharp glass, initial encounter: Secondary | ICD-10-CM | POA: Diagnosis not present

## 2021-01-21 DIAGNOSIS — S90451A Superficial foreign body, right great toe, initial encounter: Secondary | ICD-10-CM

## 2021-01-21 DIAGNOSIS — S50351A Superficial foreign body of right elbow, initial encounter: Secondary | ICD-10-CM | POA: Diagnosis not present

## 2021-01-21 DIAGNOSIS — S60551A Superficial foreign body of right hand, initial encounter: Secondary | ICD-10-CM | POA: Diagnosis not present

## 2021-01-21 DIAGNOSIS — S60512A Abrasion of left hand, initial encounter: Secondary | ICD-10-CM | POA: Diagnosis not present

## 2021-01-21 DIAGNOSIS — Z23 Encounter for immunization: Secondary | ICD-10-CM | POA: Diagnosis not present

## 2021-01-21 DIAGNOSIS — S91312A Laceration without foreign body, left foot, initial encounter: Secondary | ICD-10-CM | POA: Diagnosis not present

## 2021-01-21 DIAGNOSIS — T07XXXA Unspecified multiple injuries, initial encounter: Secondary | ICD-10-CM

## 2021-01-21 DIAGNOSIS — Z7982 Long term (current) use of aspirin: Secondary | ICD-10-CM | POA: Insufficient documentation

## 2021-01-21 DIAGNOSIS — S99922A Unspecified injury of left foot, initial encounter: Secondary | ICD-10-CM | POA: Diagnosis present

## 2021-01-21 DIAGNOSIS — S81812A Laceration without foreign body, left lower leg, initial encounter: Secondary | ICD-10-CM

## 2021-01-21 DIAGNOSIS — S40811A Abrasion of right upper arm, initial encounter: Secondary | ICD-10-CM | POA: Diagnosis not present

## 2021-01-21 DIAGNOSIS — Z87891 Personal history of nicotine dependence: Secondary | ICD-10-CM | POA: Diagnosis not present

## 2021-01-21 MED ORDER — MORPHINE SULFATE (PF) 4 MG/ML IV SOLN
4.0000 mg | Freq: Once | INTRAVENOUS | Status: AC
  Administered 2021-01-21: 4 mg via INTRAMUSCULAR
  Filled 2021-01-21: qty 1

## 2021-01-21 MED ORDER — LIDOCAINE HCL (PF) 1 % IJ SOLN
INTRAMUSCULAR | Status: AC
  Filled 2021-01-21: qty 5

## 2021-01-21 MED ORDER — OXYCODONE-ACETAMINOPHEN 5-325 MG PO TABS: 1.0000 | ORAL_TABLET | ORAL | 0 refills | Status: DC | PRN

## 2021-01-21 MED ORDER — CEPHALEXIN 500 MG PO CAPS: 500.0000 mg | ORAL_CAPSULE | Freq: Three times a day (TID) | ORAL | 0 refills | Status: DC

## 2021-01-21 MED ORDER — LIDOCAINE-EPINEPHRINE-TETRACAINE (LET) TOPICAL GEL
6.0000 mL | Freq: Once | TOPICAL | Status: AC
  Administered 2021-01-21: 6 mL via TOPICAL
  Filled 2021-01-21: qty 6

## 2021-01-21 MED ORDER — ONDANSETRON 4 MG PO TBDP
4.0000 mg | ORAL_TABLET | Freq: Once | ORAL | Status: AC
  Administered 2021-01-21: 4 mg via ORAL
  Filled 2021-01-21: qty 1

## 2021-01-21 MED ORDER — LIDOCAINE HCL (PF) 1 % IJ SOLN
15.0000 mL | Freq: Once | INTRAMUSCULAR | Status: AC
  Administered 2021-01-21: 15 mL via INTRADERMAL
  Filled 2021-01-21: qty 15

## 2021-01-21 MED ORDER — TETANUS-DIPHTH-ACELL PERTUSSIS 5-2.5-18.5 LF-MCG/0.5 IM SUSY
0.5000 mL | PREFILLED_SYRINGE | Freq: Once | INTRAMUSCULAR | Status: AC
  Administered 2021-01-21: 0.5 mL via INTRAMUSCULAR
  Filled 2021-01-21: qty 0.5

## 2021-01-21 MED ORDER — OXYCODONE-ACETAMINOPHEN 5-325 MG PO TABS
1.0000 | ORAL_TABLET | Freq: Once | ORAL | Status: AC
Start: 2021-01-21 — End: 2021-01-21
  Administered 2021-01-21: 1 via ORAL
  Filled 2021-01-21: qty 1

## 2021-01-21 NOTE — ED Notes (Signed)
See triage note    Presents s/p fall  States she fell thur a glass window   And fell down steps  Has multiple lacerations  Pain to both feet and hands and right knee

## 2021-01-21 NOTE — Discharge Instructions (Addendum)
Keep the Dermabond as dry as possible for the next 3 to 4 days.  Return emergency department for any sign of infection.  Suture should be removed from the lower leg in approximately 10 to 12 days.

## 2021-01-21 NOTE — ED Triage Notes (Signed)
Pt to ED POV c/o abrasions on R arm, R Thigh, R leg, L lower leg, L foot, and R foot after falling through a glass door around 10 mins PTA. Lacerations noted to the R palm, R big toe, L shin, and L foot. Bleeding controlled. Pt states there are pieces glass stuck everywhere but unknown where. Pt is A&Ox4 and NAD. Unknown when the last tetanus shot was.

## 2021-01-21 NOTE — ED Provider Notes (Signed)
[facility] Emergency Department Provider Note  ____________________________________________   Event Date/Time   First MD Initiated Contact with Patient 01/21/21 1206     (approximate)  I have reviewed the triage vital signs and the nursing notes.   HISTORY  Chief Complaint Laceration    HPI Gabrielle Parsons is a 35 y.o. female presents emergency department with her husband.  Patient walked through a glass door prior to arrival.  No LOC.  States multiple lacerations and multiple pieces of glass in her skin.  No head injury or LOC.  Past Medical History:  Diagnosis Date   Anxiety disorder 03/14/2015   BRCA negative 11/2018   MyRisk neg; IBIS=10.1%/riskscore=13.9%   Family history of ovarian cancer 11/2018   MyRisk neg   GERD (gastroesophageal reflux disease) 03/14/2015   Scoliosis 03/14/2015    Patient Active Problem List   Diagnosis Date Noted   Displaced comminuted fracture of shaft of left tibia, initial encounter for closed fracture 05/14/2020   Anxiety disorder 03/14/2015   GERD (gastroesophageal reflux disease) 03/14/2015   Scoliosis 03/14/2015    Past Surgical History:  Procedure Laterality Date   CESAREAN SECTION     2006, 2013   ORIF TIBIA FRACTURE Left 05/14/2020   Procedure: OPEN REDUCTION INTERNAL FIXATION (ORIF) TIBIA FRACTURE(ROD);  Surgeon: Earnestine Leys, MD;  Location: [facility] ORS;  Service: Orthopedics;  Laterality: Left;   PARTIAL HYSTERECTOMY  2008   TONSILLECTOMY  1991   TUBAL LIGATION  2008    Prior to Admission medications   Medication Sig Start Date End Date Taking? Authorizing Provider  cephALEXin (KEFLEX) 500 MG capsule Take 1 capsule (500 mg total) by mouth 3 (three) times daily. 01/21/21  Yes Lossie Kalp, Linden Dolin, PA-C  oxyCODONE-acetaminophen (PERCOCET) 5-325 MG tablet Take 1 tablet by mouth every 4 (four) hours as needed for severe pain. 01/21/21 01/21/22 Yes Psalm Schappell, Linden Dolin, PA-C  aspirin EC 325 MG tablet Take 1 tablet  (325 mg total) by mouth daily. 05/17/20   Earnestine Leys, MD  gabapentin (NEURONTIN) 400 MG capsule Take 1 capsule (400 mg total) by mouth 2 (two) times daily. 05/17/20   Earnestine Leys, MD  meloxicam (MOBIC) 15 MG tablet Take 1 tablet (15 mg total) by mouth daily. 05/17/20   Earnestine Leys, MD  Multiple Vitamin (MULTIVITAMIN WITH MINERALS) TABS tablet Take 1 tablet by mouth daily.    [provider]  sulfamethoxazole-trimethoprim (BACTRIM DS) 800-160 MG tablet Take 1 tablet by mouth 2 (two) times daily. 05/17/20   Earnestine Leys, MD    Allergies Patient has no known allergies.  Family History  Problem Relation Age of Onset   Colon cancer Maternal Grandmother    Ovarian cancer Maternal Grandmother    Hypertension Mother    Stroke Father    Heart disease Father    Heart disease Paternal Grandfather    Heart disease Maternal Grandfather    Diabetes Paternal Grandmother     Social History Social History   Tobacco Use   Smoking status: Former   Smokeless tobacco: Never  Scientific laboratory technician Use: Never used  Substance Use Topics   Alcohol use: Yes    Alcohol/week: 0.0 standard drinks   Drug use: No    Review of Systems  Constitutional: No fever/chills Eyes: No visual changes. ENT: No sore throat. Respiratory: Denies cough Cardiovascular: Denies chest pain Gastrointestinal: Denies abdominal pain Genitourinary: Negative for dysuria. Musculoskeletal: Negative for new back pain. Skin: Negative for rash. Psychiatric: no mood  changes,     ____________________________________________   PHYSICAL EXAM:  VITAL SIGNS: ED Triage Vitals  Enc Vitals Group     BP 01/21/21 1202 (!) 129/94     Pulse Rate 01/21/21 1202 94     Resp 01/21/21 1202 20     Temp 01/21/21 1202 98.4 F (36.9 C)     Temp Source 01/21/21 1202 Oral     SpO2 01/21/21 1202 99 %     Weight 01/21/21 1154 160 lb (72.6 kg)     Height 01/21/21 1154 $RemoveBefor'5\' 10"'lGxtGnNfpYXk$  (1.778 m)     Head Circumference --       Peak Flow --      Pain Score 01/21/21 1154 9     Pain Loc --      Pain Edu? --      Excl. in [place]? --     Constitutional: Alert and oriented. Well appearing and in no acute distress. Eyes: Conjunctivae are normal.  Head: Atraumatic. Nose: No congestion/rhinnorhea. Mouth/Throat: Mucous membranes are moist.   Neck:  supple no lymphadenopathy noted Cardiovascular: Normal rate, regular rhythm.  Respiratory: Normal respiratory effort.  No retractions, GU: deferred Musculoskeletal: FROM all extremities, warm and well perfused, multiple abrasions noted on right upper and lower extremities, left hand has an abrasion, left lower leg has 2 lacerations along with multiple abrasions, right hand has foreign body in the palm, right elbow has an area of hardness which may be foreign body, right knee is tender to palpation Neurologic:  Normal speech and language.  Skin:  Skin is warm, dry, multiple abrasions noted Psychiatric: Mood and affect are normal. Speech and behavior are normal.  ____________________________________________   LABS (all labs ordered are listed, but only abnormal results are displayed)  Labs Reviewed - No data to display ____________________________________________   ____________________________________________  RADIOLOGY  X-ray of the right elbow, x-ray of the right hand, x-ray of the left hand, x-ray of the right knee, x-ray of the left foot, x-ray of the right foot  ____________________________________________   PROCEDURES  Procedure(s) performed:   Marland KitchenMarland KitchenLaceration Repair  Date/Time: 01/21/2021 3:44 PM Performed by: Versie Starks, PA-C Authorized by: Versie Starks, PA-C   Consent:    Consent obtained:  Verbal   Consent given by:  Patient   Risks, benefits, and alternatives were discussed: yes     Risks discussed:  Infection, pain, retained foreign body, tendon damage, poor cosmetic result, need for additional repair, nerve damage, vascular damage and poor  wound healing   Alternatives discussed:  Delayed treatment Universal protocol:    Procedure explained and questions answered to patient or proxy's satisfaction: yes     Patient identity confirmed:  Verbally with patient Anesthesia:    Anesthesia method:  Topical application and local infiltration   Topical anesthetic:  LET   Local anesthetic:  Lidocaine 1% w/o epi Laceration details:    Location:  Leg   Leg location:  L lower leg   Length (cm):  4 Pre-procedure details:    Preparation:  Patient was prepped and draped in usual sterile fashion and imaging obtained to evaluate for foreign bodies Exploration:    Hemostasis achieved with:  LET   Imaging obtained: x-ray     Imaging outcome: foreign body not noted     Wound exploration: wound explored through full range of motion     Wound extent: no foreign bodies/material noted, no muscle damage noted, no nerve damage noted, no tendon damage noted, no underlying fracture  noted and no vascular damage noted     Contaminated: no   Treatment:    Area cleansed with:  Povidone-iodine and saline   Amount of cleaning:  Standard   Irrigation solution:  Sterile saline   Irrigation method:  Syringe and tap Skin repair:    Repair method:  Sutures   Suture size:  4-0   Suture material:  Nylon   Suture technique:  Simple interrupted   Number of sutures:  6 Approximation:    Approximation:  Close Repair type:    Repair type:  Simple Post-procedure details:    Dressing:  Non-adherent dressing   Procedure completion:  Tolerated well, no immediate complications .Marland KitchenLaceration Repair  Date/Time: 01/21/2021 3:46 PM Performed by: Versie Starks, PA-C Authorized by: Versie Starks, PA-C   Consent:    Consent obtained:  Verbal   Consent given by:  Patient   Risks discussed:  Infection, pain, retained foreign body, tendon damage, poor cosmetic result, need for additional repair, nerve damage, poor wound healing and vascular damage   Alternatives  discussed:  No treatment Universal protocol:    Procedure explained and questions answered to patient or proxy's satisfaction: yes     Patient identity confirmed:  Verbally with patient Anesthesia:    Anesthesia method:  Topical application and local infiltration   Topical anesthetic:  LET   Local anesthetic:  Lidocaine 1% w/o epi Laceration details:    Location:  Foot   Foot location:  Top of L foot   Length (cm):  2 Pre-procedure details:    Preparation:  Patient was prepped and draped in usual sterile fashion and imaging obtained to evaluate for foreign bodies Exploration:    Hemostasis achieved with:  LET   Imaging obtained: x-ray     Imaging outcome: foreign body not noted     Wound extent: no foreign bodies/material noted, no muscle damage noted, no nerve damage noted, no tendon damage noted, no underlying fracture noted and no vascular damage noted   Treatment:    Area cleansed with:  Povidone-iodine and saline   Amount of cleaning:  Standard   Irrigation solution:  Sterile saline   Irrigation method:  Syringe and tap Skin repair:    Repair method:  Sutures   Suture size:  5-0   Suture material:  Nylon   Suture technique:  Simple interrupted   Number of sutures:  5 Approximation:    Approximation:  Close Repair type:    Repair type:  Simple Post-procedure details:    Dressing:  Non-adherent dressing   Procedure completion:  Tolerated well, no immediate complications .Foreign Body Removal  Date/Time: 01/21/2021 3:48 PM Performed by: Versie Starks, PA-C Authorized by: Versie Starks, PA-C  Consent: Verbal consent obtained. Consent given by: patient Patient understanding: patient states understanding of the procedure being performed Imaging studies: imaging studies available Patient identity confirmed: verbally with patient Body area: skin General location: upper extremity Location details: right hand Anesthesia: local infiltration  Anesthesia: Local  Anesthetic: lidocaine 1% without epinephrine Patient cooperative: yes Removal mechanism: forceps Dressing: dressing applied Tendon involvement: none Depth: subcutaneous Complexity: simple 1 objects recovered. Objects recovered: glass Post-procedure assessment: foreign body removed Patient tolerance: patient tolerated the procedure well with no immediate complications  .Foreign Body Removal  Date/Time: 01/21/2021 3:49 PM Performed by: Versie Starks, PA-C Authorized by: Versie Starks, PA-C  Consent: Verbal consent obtained. Risks and benefits: risks, benefits and alternatives were discussed Consent given by: patient Patient  understanding: patient states understanding of the procedure being performed Imaging studies: imaging studies available Patient identity confirmed: verbally with patient Body area: skin General location: lower extremity Location details: right big toe Anesthesia: digital block  Anesthesia: Local Anesthetic: lidocaine 1% without epinephrine Patient cooperative: yes Removal mechanism: forceps Tendon involvement: none Depth: subcutaneous Complexity: simple 1 objects recovered. Objects recovered: glass Post-procedure assessment: foreign body removed Patient tolerance: patient tolerated the procedure well with no immediate complications  .Foreign Body Removal  Date/Time: 01/21/2021 4:19 PM Performed by: Naaman Plummer, MD Authorized by: Versie Starks, PA-C  Consent: Verbal consent obtained. Consent given by: patient Patient understanding: patient states understanding of the procedure being performed Patient identity confirmed: verbally with patient Body area: skin Anesthesia: local infiltration  Anesthesia: Local Anesthetic: lidocaine 1% without epinephrine Patient cooperative: yes Removal mechanism: forceps Dressing: dressing applied Tendon involvement: none Depth: deep Complexity: simple 1 objects recovered. Objects recovered:  glass Post-procedure assessment: foreign body removed Patient tolerance: patient tolerated the procedure well with no immediate complications     ____________________________________________   INITIAL IMPRESSION / ASSESSMENT AND PLAN / ED COURSE  Pertinent labs & imaging results that were available during my care of the patient were reviewed by me and considered in my medical decision making (see chart for details).   The patient is a 35 year old female presents after a fall through a glass door.  See HPI.  Physical exam shows patient appears stable at this time.  Tdap updated  X-ray of the right hand reviewed by me confirmed by radiology to have foreign body,  x-ray of the left hand is negative for fracture,  x-ray of the right knee is negative for fracture or foreign body X-ray of the left foot is negative for fracture or foreign body X-ray of the right foot has possible foreign body noted at the great toe, no fracture noted All x-rays were reviewed by me and confirmed by radiology  See procedure note for suture placement and foreign body removal  X-ray right elbow does show foreign body.  Please see procedure note by me and Dr. Cheri Fowler for removal.  Patient was discharged stable condition in the care of her husband  Gabrielle Parsons was evaluated in Emergency Department on 01/21/2021 for the symptoms described in the history of present illness. She was evaluated in the context of the global COVID-19 pandemic, which necessitated consideration that the patient might be at risk for infection with the SARS-CoV-2 virus that causes COVID-19. Institutional protocols and algorithms that pertain to the evaluation of patients at risk for COVID-19 are in a state of rapid change based on information released by regulatory bodies including the CDC and federal and state organizations. These policies and algorithms were followed during the patient's care in the ED.    As part of my medical  decision making, I reviewed the following data within the [place] History obtained from family, Nursing notes reviewed and incorporated, Old chart reviewed, Radiograph reviewed , Notes from prior ED visits, and [place] Controlled Substance Database  ____________________________________________   FINAL CLINICAL IMPRESSION(S) / ED DIAGNOSES  Final diagnoses:  Abrasions of multiple sites  Foot laceration, left, initial encounter  Laceration of left lower extremity, initial encounter  Foreign body of right hand, initial encounter  Foreign body of skin of right great toe  Foreign body of right elbow      NEW MEDICATIONS STARTED DURING THIS VISIT:  New Prescriptions   CEPHALEXIN (KEFLEX) 500 MG CAPSULE  Take 1 capsule (500 mg total) by mouth 3 (three) times daily.   OXYCODONE-ACETAMINOPHEN (PERCOCET) 5-325 MG TABLET    Take 1 tablet by mouth every 4 (four) hours as needed for severe pain.     Note:  This document was prepared using Dragon voice recognition software and may include unintentional dictation errors.    Versie Starks, PA-C 01/21/21 1622    [place], MD 01/22/21 [phone]

## 2021-01-21 NOTE — ED Notes (Signed)
nonadherent strips and pad with gauze wrap applied to right great toe, top of left foot. Right hand cleaned and irrigated and dressed with nonadherent strips and pad and gauze wrap.

## 2021-02-07 ENCOUNTER — Ambulatory Visit: Payer: 59 | Admitting: Internal Medicine

## 2021-03-22 ENCOUNTER — Other Ambulatory Visit: Payer: Self-pay

## 2021-03-22 ENCOUNTER — Ambulatory Visit (INDEPENDENT_AMBULATORY_CARE_PROVIDER_SITE_OTHER): Payer: 59 | Admitting: Advanced Practice Midwife

## 2021-03-22 ENCOUNTER — Encounter: Payer: Self-pay | Admitting: Advanced Practice Midwife

## 2021-03-22 ENCOUNTER — Other Ambulatory Visit (HOSPITAL_COMMUNITY)
Admission: RE | Admit: 2021-03-22 | Discharge: 2021-03-22 | Disposition: A | Discharge status: Home / Self Care | Payer: 59 | Source: Ambulatory Visit | Attending: Advanced Practice Midwife | Admitting: Advanced Practice Midwife

## 2021-03-22 VITALS — BP 122/74 | Ht 70.0 in | Wt 154.0 lb

## 2021-03-22 DIAGNOSIS — Z113 Encounter for screening for infections with a predominantly sexual mode of transmission: Secondary | ICD-10-CM

## 2021-03-22 DIAGNOSIS — Z1159 Encounter for screening for other viral diseases: Secondary | ICD-10-CM | POA: Diagnosis not present

## 2021-03-22 DIAGNOSIS — N898 Other specified noninflammatory disorders of vagina: Secondary | ICD-10-CM | POA: Diagnosis not present

## 2021-03-22 DIAGNOSIS — Z01419 Encounter for gynecological examination (general) (routine) without abnormal findings: Secondary | ICD-10-CM | POA: Diagnosis present

## 2021-03-22 DIAGNOSIS — N3941 Urge incontinence: Secondary | ICD-10-CM

## 2021-03-22 NOTE — Patient Instructions (Signed)

## 2021-03-22 NOTE — Progress Notes (Signed)
Gynecology Annual Exam   PCP: Lorelee Market, MD  Chief Complaint:  Chief Complaint  Patient presents with   Annual Exam    History of Present Illness: Patient is a 35 y.o. [phone] presents for annual exam. The patient has complaints today of possible vaginitis or exposure to STD. Her last intercourse with her husband was 2 weeks ago and since then she is having thick white discharge with itching, irritation and odor. She has a concern that he may have been "cheating" on her. They are also in the process of a separation. She requests STD/vaginitis testing. She also mentions leaking small amounts of urine due to urgency a couple times per week. She reports doing kegel exercises daily.   LMP: Patient's last menstrual period was 03/14/2021 (exact date). Average Interval: regular, 28 days Duration of flow: 3 days Heavy Menses: no Clots: no Intermenstrual Bleeding: no Postcoital Bleeding: no Dysmenorrhea: no  The patient is sexually active. She currently uses tubal ligation for contraception. She denies dyspareunia.  The patient does perform self breast exams.  There is notable family history of breast or ovarian cancer in her family. The patient is BRCA negative.  The patient wears seatbelts: yes.   The patient has regular exercise:  she works out 4 times per week with a trainer, she admits healthy diet, adequate hydration and adequate sleep .    The patient denies current symptoms of depression. She has a therapist and a good support system.   Review of Systems: Review of Systems  Constitutional:  Negative for chills and fever.  HENT:  Negative for congestion, ear discharge, ear pain, hearing loss, sinus pain and sore throat.   Eyes:  Negative for blurred vision and double vision.  Respiratory:  Negative for cough, shortness of breath and wheezing.   Cardiovascular:  Negative for chest pain, palpitations and leg swelling.  Gastrointestinal:  Negative for abdominal pain, blood  in stool, constipation, diarrhea, heartburn, melena, nausea and vomiting.  Genitourinary:  Negative for dysuria, flank pain, frequency, hematuria and urgency.  Musculoskeletal:  Negative for back pain, joint pain and myalgias.  Skin:  Negative for itching and rash.  Neurological:  Negative for dizziness, tingling, tremors, sensory change, speech change, focal weakness, seizures, loss of consciousness, weakness and headaches.  Endo/Heme/Allergies:  Negative for environmental allergies. Does not bruise/bleed easily.  Psychiatric/Behavioral:  Negative for depression, hallucinations, memory loss, substance abuse and suicidal ideas. The patient is not nervous/anxious and does not have insomnia.    Past Medical History:  Patient Active Problem List   Diagnosis Date Noted   Displaced comminuted fracture of shaft of left tibia, initial encounter for closed fracture 05/14/2020   Anxiety disorder 03/14/2015   GERD (gastroesophageal reflux disease) 03/14/2015   Scoliosis 03/14/2015    Past Surgical History:  Past Surgical History:  Procedure Laterality Date   CESAREAN SECTION     2006, 2013   ORIF TIBIA FRACTURE Left 05/14/2020   Procedure: OPEN REDUCTION INTERNAL FIXATION (ORIF) TIBIA FRACTURE(ROD);  Surgeon: Earnestine Leys, MD;  Location: [facility] ORS;  Service: Orthopedics;  Laterality: Left;   PARTIAL HYSTERECTOMY  2008   TONSILLECTOMY  1991   TUBAL LIGATION  2008    Gynecologic History:  Patient's last menstrual period was 03/14/2021 (exact date). Contraception: tubal ligation Last Pap: 2 years ago Results were: no abnormalities   Obstetric History: [id number]  Family History:  Family History  Problem Relation Age of Onset   Colon cancer Maternal Grandmother  Ovarian cancer Maternal Grandmother    Hypertension Mother    Stroke Father    Heart disease Father    Heart disease Paternal Grandfather    Heart disease Maternal Grandfather    Diabetes Paternal Grandmother     Social  History:  Social History   Socioeconomic History   Marital status: Married    Spouse name: Not on file   Number of children: Not on file   Years of education: Not on file   Highest education level: Not on file  Occupational History   Not on file  Tobacco Use   Smoking status: Former   Smokeless tobacco: Never  Vaping Use   Vaping Use: Never used  Substance and Sexual Activity   Alcohol use: Yes    Alcohol/week: 0.0 standard drinks   Drug use: No   Sexual activity: Yes  Other Topics Concern   Not on file  Social History Narrative   Not on file   Social Determinants of Health   Financial Resource Strain: Not on file  Food Insecurity: Not on file  Transportation Needs: Not on file  Physical Activity: Not on file  Stress: Not on file  Social Connections: Not on file  Intimate Partner Violence: Not on file    Allergies:  No Known Allergies  Medications: Prior to Admission medications   Not on File    Physical Exam Vitals: Blood pressure 122/74, height $RemoveBefore'5\' 10"'PiqPJlBZCEbma$  (1.778 m), weight 154 lb (69.9 kg), last menstrual period 03/14/2021.  General: NAD HEENT: normocephalic, anicteric Thyroid: no enlargement, no palpable nodules Pulmonary: No increased work of breathing, CTAB Cardiovascular: RRR, distal pulses 2+ Breast: Breast symmetrical, no tenderness, no palpable nodules or masses, no skin or nipple retraction present, no nipple discharge.  No axillary or supraclavicular lymphadenopathy. Abdomen: NABS, soft, non-tender, non-distended.  Umbilicus without lesions.  No hepatomegaly, splenomegaly or masses palpable. No evidence of hernia  Genitourinary:  External: Normal external female genitalia.  Normal urethral meatus, normal Bartholin's and Skene's glands.    Vagina: Normal vaginal mucosa, no evidence of prolapse, muscle tone ok Extremities: no edema, erythema, or tenderness Neurologic: Grossly intact Psychiatric: mood appropriate, affect full    Assessment: 35 y.o.  [id number] routine annual exam  Plan: Problem List Items Addressed This Visit   None Visit Diagnoses     Well woman exam with routine gynecological exam    -  Primary   Relevant Orders   Cervicovaginal ancillary only   RPR Qual   Hepatitis B surface antibody,qualitative   Hepatitis C antibody   HIV Antibody (routine testing w rflx)   Ambulatory referral to Physical Therapy   Screen for sexually transmitted diseases       Relevant Orders   Cervicovaginal ancillary only   RPR Qual   Hepatitis B surface antibody,qualitative   Hepatitis C antibody   HIV Antibody (routine testing w rflx)   Vaginal discharge       Relevant Orders   Cervicovaginal ancillary only   Vaginal odor       Relevant Orders   Cervicovaginal ancillary only   Vaginal irritation       Relevant Orders   Cervicovaginal ancillary only   Vagina itching       Relevant Orders   Cervicovaginal ancillary only   Need for hepatitis B screening test       Relevant Orders   Hepatitis B surface antibody,qualitative   Need for hepatitis C screening test       Relevant  Orders   Hepatitis C antibody   Urge incontinence of urine       Relevant Orders   Ambulatory referral to Physical Therapy       1) STI screening  was offered and accepted  2)  ASCCP guidelines and rationale discussed.  Patient opts for every 3 years screening interval. PAP due next year  3) Contraception - the patient is currently using  tubal ligation.    4) Routine healthcare maintenance including cholesterol, diabetes screening discussed Declines  5) Urinary incontinence: referral for pelvic floor PT  6) Return in about 1 year (around 03/22/2022) for annual established gyn.   Rod Can, [facility] 03/22/2021, 3:23 PM

## 2021-03-23 LAB — HEPATITIS B SURFACE ANTIBODY,QUALITATIVE: Hep B Surface Ab, Qual: REACTIVE

## 2021-03-23 LAB — RPR QUALITATIVE: RPR Ser Ql: NONREACTIVE

## 2021-03-23 LAB — HIV ANTIBODY (ROUTINE TESTING W REFLEX): HIV Screen 4th Generation wRfx: NONREACTIVE

## 2021-03-23 LAB — HEPATITIS C ANTIBODY: Hep C Virus Ab: <0.1 s/co ratio (ref 0.0–0.9)

## 2021-03-26 LAB — CERVICOVAGINAL ANCILLARY ONLY
Bacterial Vaginitis (gardnerella): NEGATIVE
Candida Glabrata: NEGATIVE
Candida Vaginitis: NEGATIVE
Chlamydia: NEGATIVE
Comment: NEGATIVE
Comment: NEGATIVE
Comment: NEGATIVE
Comment: NEGATIVE
Comment: NEGATIVE
Comment: NORMAL
Neisseria Gonorrhea: NEGATIVE
Trichomonas: NEGATIVE

## 2021-11-12 ENCOUNTER — Ambulatory Visit (INDEPENDENT_AMBULATORY_CARE_PROVIDER_SITE_OTHER): Payer: 59 | Admitting: Obstetrics

## 2021-11-12 ENCOUNTER — Other Ambulatory Visit (HOSPITAL_COMMUNITY)
Admission: RE | Admit: 2021-11-12 | Discharge: 2021-11-12 | Disposition: A | Discharge status: Home / Self Care | Payer: 59 | Source: Ambulatory Visit | Attending: Obstetrics | Admitting: Obstetrics

## 2021-11-12 VITALS — BP 122/70 | Wt 152.0 lb

## 2021-11-12 DIAGNOSIS — Z8619 Personal history of other infectious and parasitic diseases: Secondary | ICD-10-CM

## 2021-11-12 DIAGNOSIS — N898 Other specified noninflammatory disorders of vagina: Secondary | ICD-10-CM | POA: Diagnosis present

## 2021-11-12 DIAGNOSIS — R300 Dysuria: Secondary | ICD-10-CM

## 2021-11-12 LAB — POCT URINALYSIS DIPSTICK
Bilirubin, UA: NEGATIVE
Blood, UA: NEGATIVE
Glucose, UA: NEGATIVE
Ketones, UA: NEGATIVE
Leukocytes, UA: NEGATIVE
Nitrite, UA: NEGATIVE
Protein, UA: NEGATIVE
Spec Grav, UA: <=1.005 — AB (ref 1.010–1.025)
Urobilinogen, UA: 0.2 E.U./dL
pH, UA: 6.5 (ref 5.0–8.0)

## 2021-11-12 MED ORDER — ACYCLOVIR 800 MG PO TABS: 800.0000 mg | ORAL_TABLET | Freq: Every day | ORAL | 0 refills | Status: AC

## 2021-11-12 NOTE — Progress Notes (Signed)
[name] is a 36 y.o. [id number] who LMP was Patient's last menstrual period was 10/22/2021 (exact date)., presents today for a problem visit.   Patient complains of an abnormal vaginal discharge for 5 days. Discharge described as: white and malodorous. Vaginal symptoms include burning and local irritation.   Other associated symptoms: none.Menstrual pattern: She had been bleeding regularly. Contraception: tubal ligation.  She denies recent antibiotic exposure, denies changes in soaps, detergents coinciding with the onset of her symptoms.  She has previously self treated  with some Monistat as she thought perhaps she has a yeast infection. Of note, she just [place] the beach.  Initially she thought she might have a UTI, but hr urine today is clear and without bacteria on clean catch. Her history is significant for HSV, although she has rarely had any outbreaks over the last many years.  BP 122/70   Wt 152 lb (68.9 kg)   LMP 10/22/2021 (Exact Date)   BMI 21.81 kg/m  Review of Systems  Constitutional: Negative.   HENT: Negative.    Eyes: Negative.   Respiratory: Negative.    Cardiovascular: Negative.   Gastrointestinal: Negative.   Genitourinary:  Positive for dysuria.  Musculoskeletal: Negative.   Skin:        Local irritation, some dysuria and vaginal irritation.  Neurological: Negative.   Endo/Heme/Allergies: Negative.   Psychiatric/Behavioral:         Hx of anxiety   Physical Exam Constitutional:      Appearance: Normal appearance. She is normal weight.  HENT:     Head: Normocephalic and atraumatic.  Cardiovascular:     Rate and Rhythm: Normal rate and regular rhythm.  Pulmonary:     Effort: Pulmonary effort is normal.     Breath sounds: Normal breath sounds.  Abdominal:     General: Abdomen is flat.     Palpations: Abdomen is soft.  Genitourinary:    General: Normal vulva.     Rectum: Normal.     Comments: Spec exam: moderate amount of white, thin discharge  noted. No exteranl esions or redness noted. Normal vaginal rugae and mucosa. Aptima swab retrieved. Skin:    General: Skin is warm and dry.  Neurological:     General: No focal deficit present.     Mental Status: She is alert and oriented to person, place, and time.  Psychiatric:        Mood and Affect: Mood normal.        Behavior: Behavior normal.    A: vaginal discharge Local irritation Hx of HSV Plan: aptima swab sent. She is reassured by her CCUA that she does not have a UTI.  1) Risk factors for bacterial vaginosis and candida infections discussed.  We discussed normal vaginal flora/microbiome.  Any factors that may alter the microbiome increase the risk of these opportunistic infections.  These include changes in pH, antibiotic exposures, diabetes, wet bathing suits etc.  We discussed that treatment is aimed at eradicating abnormal bacterial overgrowth and or yeast.  There may be some role for vaginal probiotics in restoring normal vaginal flora.     Will contact her once the lab results are in and treat accordingly. I have also sent in  a Rx for Valtrex as she does not have any at home-  Gabrielle Parsons, [facility]  11/12/2021 5:49 PM

## 2021-11-14 LAB — CERVICOVAGINAL ANCILLARY ONLY
Bacterial Vaginitis (gardnerella): NEGATIVE
Candida Glabrata: NEGATIVE
Candida Vaginitis: NEGATIVE
Comment: NEGATIVE
Comment: NEGATIVE
Comment: NEGATIVE

## 2021-11-18 ENCOUNTER — Encounter: Payer: Self-pay | Admitting: Obstetrics

## 2022-05-03 ENCOUNTER — Ambulatory Visit: Payer: Self-pay | Admitting: *Deleted

## 2022-05-03 NOTE — Telephone Encounter (Signed)
  Chief Complaint: pain in left rib area under left breast.  Symptoms: nausea, sweating HR 120's, pain in left rib area under left breast. Pain breathing in . Constant dull pain  Frequency: 2 days  Pertinent Negatives: Patient denies chest pain , no difficulty breathing no vomiting no dizziness. Disposition: [x] ED /[] Urgent Care (no appt availability in office) / [] Appointment(In office/virtual)/ []  [facility]/ [] Home Care/ [] Refused Recommended Disposition /[] [facility]/ []  Follow-up with PCP Additional Notes:   Recommended ED for evaluation     Reason for Disposition  [1] Chest pain lasts > 5 minutes AND [2] occurred in past 3 days (72 hours) (Exception: Feels exactly the same as previously diagnosed heartburn and has accompanying sour taste in mouth.)  Answer Assessment - Initial Assessment Questions 1. LOCATION: "Where does it hurt?"       Left rib area and back pain  2. RADIATION: "Does the pain go anywhere else?" (e.g., into neck, jaw, arms, back)    back and above  hip area 3. ONSET: "When did the chest pain begin?" (Minutes, hours or days)      2 days  4. PATTERN: "Does the pain come and go, or has it been constant since it started?"  "Does it get worse with exertion?"      Constant  5. DURATION: "How long does it last" (e.g., seconds, minutes, hours)     Keeping up at night  6. SEVERITY: "How bad is the pain?"  (e.g., Scale 1-10; mild, moderate, or severe)    - MILD (1-3): doesn't interfere with normal activities     - MODERATE (4-7): interferes with normal activities or awakens from sleep    - SEVERE (8-10): excruciating pain, unable to do any normal activities       Constant dull pain heart rate on apple watch 120's at rest  7. CARDIAC RISK FACTORS: "Do you have any history of heart problems or risk factors for heart disease?" (e.g., angina, prior heart attack; diabetes, high blood pressure, high cholesterol, smoker, or strong family history of heart  disease)     Family hx 8. PULMONARY RISK FACTORS: "Do you have any history of lung disease?"  (e.g., blood clots in lung, asthma, emphysema, birth control pills)     na 9. CAUSE: "What do you think is causing the chest pain?"     Not sure  10. OTHER SYMPTOMS: "Do you have any other symptoms?" (e.g., dizziness, nausea, vomiting, sweating, fever, difficulty breathing, cough)       Nausea cough, sweating HR 120's. Pain under left breast rib area pain with breathing in  11. PREGNANCY: "Is there any chance you are pregnant?" "When was your last menstrual period?"       Na  Protocols used: Chest Pain-A-AH

## 2022-06-18 DIAGNOSIS — M79662 Pain in left lower leg: Secondary | ICD-10-CM | POA: Diagnosis not present

## 2022-06-18 DIAGNOSIS — R2231 Localized swelling, mass and lump, right upper limb: Secondary | ICD-10-CM | POA: Diagnosis not present

## 2022-06-20 DIAGNOSIS — S60551A Superficial foreign body of right hand, initial encounter: Secondary | ICD-10-CM | POA: Diagnosis not present

## 2022-06-27 ENCOUNTER — Other Ambulatory Visit: Payer: Self-pay

## 2022-06-27 ENCOUNTER — Encounter
Admission: RE | Admit: 2022-06-27 | Discharge: 2022-06-27 | Disposition: A | Discharge status: Home / Self Care | Payer: Self-pay | Source: Ambulatory Visit | Attending: Specialist | Admitting: Specialist

## 2022-06-27 DIAGNOSIS — Z01812 Encounter for preprocedural laboratory examination: Secondary | ICD-10-CM

## 2022-06-27 HISTORY — DX: Other specified postprocedural states: Z98.890

## 2022-06-27 HISTORY — DX: Other specified postprocedural states: R11.2

## 2022-06-27 HISTORY — DX: Other complications of anesthesia, initial encounter: T88.59XA

## 2022-06-27 NOTE — Patient Instructions (Addendum)
Your procedure is scheduled on: 07/04/22 - Thursday Report to the Registration Desk on the 1st floor of the [facility]. To find out your arrival time, please call [phone] between 1PM - 3PM on: 07/03/22 - Wednesday If your arrival time is 6:00 am, do not arrive prior to that time as the [facility] entrance doors do not open until 6:00 am.  REMEMBER: Instructions that are not followed completely may result in serious medical risk, up to and including death; or upon the discretion of your surgeon and anesthesiologist your surgery may need to be rescheduled.  Do not eat food after midnight the night before surgery.  No gum chewing, lozengers or hard candies.  You may however, drink CLEAR liquids up to 2 hours before you are scheduled to arrive for your surgery. Do not drink anything within 2 hours of your scheduled arrival time.  Clear liquids include: - water  - apple juice without pulp - gatorade (not RED colors) - black coffee or tea (Do NOT add milk or creamers to the coffee or tea) Do NOT drink anything that is not on this list.  TAKE THESE MEDICATIONS THE MORNING OF SURGERY WITH A SIP OF WATER: NONE  One week prior to surgery: STOP MOBIC   Stop Anti-inflammatories (NSAIDS) such as Advil, Aleve, Ibuprofen, Motrin, Naproxen, Naprosyn and Aspirin based products such as Excedrin, Goodys Powder, BC Powder.  Stop ANY OVER THE COUNTER supplements until after surgery.  You may take Tylenol if needed for pain up until the day of surgery.  No Alcohol for 24 hours before or after surgery.  No Smoking including e-cigarettes for 24 hours prior to surgery.  No chewable tobacco products for at least 6 hours prior to surgery.  No nicotine patches on the day of surgery.  Do not use any "recreational" drugs for at least a week prior to your surgery.  Please be advised that the combination of cocaine and anesthesia may have negative outcomes, up to and including death. If you test  positive for cocaine, your surgery will be cancelled.  On the morning of surgery brush your teeth with toothpaste and water, you may rinse your mouth with mouthwash if you wish. Do not swallow any toothpaste or mouthwash.  Use CHG Soap or wipes as directed on instruction sheet.  Do not wear jewelry, make-up, hairpins, clips or nail polish.  Do not wear lotions, powders, or perfumes.   Do not shave body from the neck down 48 hours prior to surgery just in case you cut yourself which could leave a site for infection.  Also, freshly shaved skin may become irritated if using the CHG soap.  Contact lenses, hearing aids and dentures may not be worn into surgery.  Do not bring valuables to the hospital. [facility] is not responsible for any missing/lost belongings or valuables.   Notify your doctor if there is any change in your medical condition (cold, fever, infection).  Wear comfortable clothing (specific to your surgery type) to the hospital.  After surgery, you can help prevent lung complications by doing breathing exercises.  Take deep breaths and cough every 1-2 hours. Your doctor may order a device called an Incentive Spirometer to help you take deep breaths. When coughing or sneezing, hold a pillow firmly against your incision with both hands. This is called "splinting." Doing this helps protect your incision. It also decreases belly discomfort.  If you are being admitted to the hospital overnight, leave your suitcase in the  car. After surgery it may be brought to your room.  If you are being discharged the day of surgery, you will not be allowed to drive home. You will need a responsible adult (18 years or older) to drive you home and stay with you that night.   If you are taking public transportation, you will need to have a responsible adult (18 years or older) with you. Please confirm with your physician that it is acceptable to use public transportation.   Please call the  [facility]. at [phone] if you have any questions about these instructions.  Surgery Visitation Policy:  Patients undergoing a surgery or procedure may have two family members or support persons with them as long as the person is not COVID-19 positive or experiencing its symptoms.   Inpatient Visitation:    Visiting hours are 7 a.m. to 8 p.m. Up to four visitors are allowed at one time in a patient room. The visitors may rotate out with other people during the day. One designated support person (adult) may remain overnight.  Due to an increase in RSV and influenza rates and associated hospitalizations, children ages 29 and under will not be able to visit patients in [facility]. Masks continue to be strongly recommended.

## 2022-07-03 ENCOUNTER — Other Ambulatory Visit: Payer: Self-pay | Admitting: Specialist

## 2022-07-03 NOTE — H&P (Signed)
PREOPERATIVE H&P  Chief Complaint: S60.551A Superficial foreign body of right hand, initial encounter  HPI: Gabrielle Parsons is a 37 y.o. female who presents for preoperative history and physical with a diagnosis of S60.551A Superficial foreign body of right hand, initial encounter.  She fell through a plate glass a year ago and had some glass in her hand.  Will were able to remove some of it in the office but she has increased symptoms from an area in the mid palm which is larger.  Symptoms are rated as moderate to severe, and have been worsening.  This is significantly impairing activities of daily living.  I advised her that we can try to remove this in the operating room under proper conditions.  She has elected for surgical management.   Past Medical History:  Diagnosis Date   Anxiety disorder 03/14/2015   BRCA negative 11/2018   MyRisk neg; HYQM=57.8%/IONGEXBMW=41.3%   Complication of anesthesia    Family history of ovarian cancer 11/2018   MyRisk neg   GERD (gastroesophageal reflux disease) 03/14/2015   PONV (postoperative nausea and vomiting)    Scoliosis 03/14/2015   Past Surgical History:  Procedure Laterality Date   ABDOMINAL HYSTERECTOMY     CESAREAN SECTION     2006, 2013   ORIF TIBIA FRACTURE Left 05/14/2020   Procedure: OPEN REDUCTION INTERNAL FIXATION (ORIF) TIBIA FRACTURE(ROD);  Surgeon: Earnestine Leys, MD;  Location: [facility] ORS;  Service: Orthopedics;  Laterality: Left;   PARTIAL HYSTERECTOMY  06/03/2006   TONSILLECTOMY  06/03/1989   TUBAL LIGATION  06/03/2006   Social History   Socioeconomic History   Marital status: Divorced    Spouse name: Not on file   Number of children: 1   Years of education: Not on file   Highest education level: Not on file  Occupational History   Not on file  Tobacco Use   Smoking status: Former   Smokeless tobacco: Never  Vaping Use   Vaping Use: Never used  Substance and Sexual Activity   Alcohol use: Yes    Comment: weekly    Drug use: No   Sexual activity: Yes  Other Topics Concern   Not on file  Social History Narrative   Not on file   Social Determinants of Health   Financial Resource Strain: Not on file  Food Insecurity: Not on file  Transportation Needs: Not on file  Physical Activity: Not on file  Stress: Not on file  Social Connections: Not on file   Family History  Problem Relation Age of Onset   Colon cancer Maternal Grandmother    Ovarian cancer Maternal Grandmother    Hypertension Mother    Stroke Father    Heart disease Father    Heart disease Paternal Grandfather    Heart disease Maternal Grandfather    Diabetes Paternal Grandmother    No Known Allergies Prior to Admission medications   Medication Sig Start Date End Date Taking? Authorizing Provider  acetaminophen (TYLENOL) 500 MG tablet Take 1,000 mg by mouth every 6 (six) hours as needed.    [provider]  ibuprofen (ADVIL) 200 MG tablet Take 400 mg by mouth every 6 (six) hours as needed.    [provider]  meloxicam (MOBIC) 15 MG tablet Take 15 mg by mouth daily.    [provider]     Positive ROS: All other systems have been reviewed and were otherwise negative with the exception of those mentioned in the HPI and as above.  Physical Exam: General: Alert, no acute distress Cardiovascular: No pedal edema. Heart is regular and without murmur.  Respiratory: No cyanosis, no use of accessory musculature. Lungs are clear. GI: No organomegaly, abdomen is soft and non-tender Skin: No lesions in the area of chief complaint Neurologic: Sensation intact distally Psychiatric: Patient is competent for consent with normal mood and affect Lymphatic: No axillary or cervical lymphadenopathy  MUSCULOSKELETAL: Healed wound in the mid palm.  Is raised and tender.  Feels as though there may be glass or foreign body there.  Neurovascular status is good distally.  Assessment: S60.551A Superficial foreign body of  right hand, initial encounter  Plan: Plan for Procedure(s): INCISION AND REMOVAL OF FOREIGN BODY, SUBCUTANEOUS TISSUES; SIMPLE  The risks benefits and alternatives were discussed with the patient including but not limited to the risks of nonoperative treatment, versus surgical intervention including infection, bleeding, nerve injury,  blood clots, cardiopulmonary complications, morbidity, mortality, among others, and they were willing to proceed.   Park Breed, MD [phone]   07/03/2022 10:37 AM

## 2022-07-04 ENCOUNTER — Encounter: Payer: Self-pay | Admitting: Specialist

## 2022-07-04 ENCOUNTER — Ambulatory Visit
Admission: RE | Admit: 2022-07-04 | Discharge: 2022-07-04 | Disposition: A | Discharge status: Home / Self Care | Payer: 59 | Attending: Specialist | Admitting: Specialist

## 2022-07-04 ENCOUNTER — Other Ambulatory Visit: Payer: Self-pay

## 2022-07-04 ENCOUNTER — Ambulatory Visit: Payer: 59 | Admitting: Certified Registered"

## 2022-07-04 ENCOUNTER — Encounter
Admission: RE | Disposition: A | Discharge status: Home / Self Care | Payer: Self-pay | Source: Home / Self Care | Attending: Specialist

## 2022-07-04 DIAGNOSIS — M720 Palmar fascial fibromatosis [Dupuytren]: Secondary | ICD-10-CM | POA: Diagnosis not present

## 2022-07-04 DIAGNOSIS — Z87891 Personal history of nicotine dependence: Secondary | ICD-10-CM | POA: Insufficient documentation

## 2022-07-04 DIAGNOSIS — S60551A Superficial foreign body of right hand, initial encounter: Secondary | ICD-10-CM | POA: Diagnosis not present

## 2022-07-04 DIAGNOSIS — F419 Anxiety disorder, unspecified: Secondary | ICD-10-CM | POA: Diagnosis not present

## 2022-07-04 DIAGNOSIS — K219 Gastro-esophageal reflux disease without esophagitis: Secondary | ICD-10-CM | POA: Insufficient documentation

## 2022-07-04 DIAGNOSIS — Z01812 Encounter for preprocedural laboratory examination: Secondary | ICD-10-CM

## 2022-07-04 DIAGNOSIS — R69 Illness, unspecified: Secondary | ICD-10-CM | POA: Diagnosis not present

## 2022-07-04 HISTORY — PX: INCISION AND DRAINAGE WOUND WITH FOREIGN BODY REMOVAL: SHX5635

## 2022-07-04 LAB — POCT PREGNANCY, URINE: Preg Test, Ur: NEGATIVE

## 2022-07-04 SURGERY — INCISION AND DRAINAGE WOUND WITH FOREIGN BODY REMOVAL
Anesthesia: General | Laterality: Right

## 2022-07-04 MED ORDER — ACETAMINOPHEN 10 MG/ML IV SOLN
INTRAVENOUS | Status: AC
  Filled 2022-07-04: qty 100

## 2022-07-04 MED ORDER — MELOXICAM 15 MG PO TABS: 15.0000 mg | ORAL_TABLET | Freq: Every day | ORAL | 3 refills | Status: DC

## 2022-07-04 MED ORDER — MELOXICAM 7.5 MG PO TABS: 15.0000 mg | ORAL_TABLET | ORAL | Status: AC

## 2022-07-04 MED ORDER — MIDAZOLAM HCL 2 MG/2ML IJ SOLN
INTRAMUSCULAR | Status: AC
  Filled 2022-07-04: qty 2

## 2022-07-04 MED ORDER — CHLORHEXIDINE GLUCONATE CLOTH 2 % EX PADS: 6.0000 | MEDICATED_PAD | Freq: Once | CUTANEOUS | Status: DC

## 2022-07-04 MED ORDER — GABAPENTIN 400 MG PO CAPS: 400.0000 mg | ORAL_CAPSULE | Freq: Three times a day (TID) | ORAL | 3 refills | Status: DC

## 2022-07-04 MED ORDER — GABAPENTIN 300 MG PO CAPS
ORAL_CAPSULE | ORAL | Status: AC
  Administered 2022-07-04: 300 mg via ORAL
  Filled 2022-07-04: qty 1

## 2022-07-04 MED ORDER — ACETAMINOPHEN 10 MG/ML IV SOLN
INTRAVENOUS | Status: DC | PRN
  Administered 2022-07-04: 1000 mg via INTRAVENOUS

## 2022-07-04 MED ORDER — PROPOFOL 10 MG/ML IV BOLUS
INTRAVENOUS | Status: AC
  Filled 2022-07-04: qty 20

## 2022-07-04 MED ORDER — PROMETHAZINE HCL 25 MG/ML IJ SOLN: 6.2500 mg | INTRAMUSCULAR | Status: DC | PRN

## 2022-07-04 MED ORDER — ONDANSETRON HCL 4 MG/2ML IJ SOLN
INTRAMUSCULAR | Status: DC | PRN
  Administered 2022-07-04: 4 mg via INTRAVENOUS

## 2022-07-04 MED ORDER — BUPIVACAINE HCL (PF) 0.5 % IJ SOLN
INTRAMUSCULAR | Status: AC
  Filled 2022-07-04: qty 30

## 2022-07-04 MED ORDER — DEXAMETHASONE SODIUM PHOSPHATE 10 MG/ML IJ SOLN
INTRAMUSCULAR | Status: DC | PRN
  Administered 2022-07-04: 10 mg via INTRAVENOUS

## 2022-07-04 MED ORDER — 0.9 % SODIUM CHLORIDE (POUR BTL) OPTIME
TOPICAL | Status: DC | PRN
  Administered 2022-07-04: 500 mL

## 2022-07-04 MED ORDER — DEXMEDETOMIDINE HCL IN NACL 80 MCG/20ML IV SOLN
INTRAVENOUS | Status: DC | PRN
  Administered 2022-07-04 (×2): 8 ug via BUCCAL

## 2022-07-04 MED ORDER — OXYCODONE HCL 5 MG PO TABS
5.0000 mg | ORAL_TABLET | Freq: Once | ORAL | Status: AC
  Administered 2022-07-04: 5 mg via ORAL

## 2022-07-04 MED ORDER — CHLORHEXIDINE GLUCONATE 0.12 % MT SOLN
OROMUCOSAL | Status: AC
  Administered 2022-07-04: 15 mL via OROMUCOSAL
  Filled 2022-07-04: qty 15

## 2022-07-04 MED ORDER — FAMOTIDINE 20 MG PO TABS
ORAL_TABLET | ORAL | Status: AC
  Administered 2022-07-04: 20 mg via ORAL
  Filled 2022-07-04: qty 1

## 2022-07-04 MED ORDER — TRAMADOL HCL 50 MG PO TABS: 50.0000 mg | ORAL_TABLET | Freq: Four times a day (QID) | ORAL | 3 refills | Status: DC | PRN

## 2022-07-04 MED ORDER — CEFAZOLIN SODIUM-DEXTROSE 2-4 GM/100ML-% IV SOLN
INTRAVENOUS | Status: AC
  Filled 2022-07-04: qty 100

## 2022-07-04 MED ORDER — BUPIVACAINE HCL 0.5 % IJ SOLN
INTRAMUSCULAR | Status: DC | PRN
  Administered 2022-07-04: 13 mL

## 2022-07-04 MED ORDER — MIDAZOLAM HCL 2 MG/2ML IJ SOLN
INTRAMUSCULAR | Status: DC | PRN
  Administered 2022-07-04: 2 mg via INTRAVENOUS

## 2022-07-04 MED ORDER — CEFAZOLIN SODIUM-DEXTROSE 2-4 GM/100ML-% IV SOLN
2.0000 g | INTRAVENOUS | Status: AC
  Administered 2022-07-04: 2 g via INTRAVENOUS

## 2022-07-04 MED ORDER — FAMOTIDINE 20 MG PO TABS: 20.0000 mg | ORAL_TABLET | Freq: Once | ORAL | Status: AC

## 2022-07-04 MED ORDER — MELOXICAM 7.5 MG PO TABS
ORAL_TABLET | ORAL | Status: AC
  Administered 2022-07-04: 15 mg via ORAL
  Filled 2022-07-04: qty 2

## 2022-07-04 MED ORDER — FENTANYL CITRATE (PF) 100 MCG/2ML IJ SOLN: 25.0000 ug | INTRAMUSCULAR | Status: DC | PRN

## 2022-07-04 MED ORDER — LIDOCAINE HCL (PF) 2 % IJ SOLN
INTRAMUSCULAR | Status: AC
  Filled 2022-07-04: qty 5

## 2022-07-04 MED ORDER — ORAL CARE MOUTH RINSE: 15.0000 mL | Freq: Once | OROMUCOSAL | Status: AC

## 2022-07-04 MED ORDER — ONDANSETRON HCL 4 MG/2ML IJ SOLN
INTRAMUSCULAR | Status: AC
  Filled 2022-07-04: qty 2

## 2022-07-04 MED ORDER — OXYCODONE HCL 5 MG PO TABS
ORAL_TABLET | ORAL | Status: AC
  Filled 2022-07-04: qty 1

## 2022-07-04 MED ORDER — DEXMEDETOMIDINE HCL IN NACL 80 MCG/20ML IV SOLN
INTRAVENOUS | Status: AC
  Filled 2022-07-04: qty 20

## 2022-07-04 MED ORDER — CHLORHEXIDINE GLUCONATE 0.12 % MT SOLN: 15.0000 mL | Freq: Once | OROMUCOSAL | Status: AC

## 2022-07-04 MED ORDER — GABAPENTIN 300 MG PO CAPS: 300.0000 mg | ORAL_CAPSULE | ORAL | Status: AC

## 2022-07-04 MED ORDER — NEOMYCIN-POLYMYXIN B GU 40-200000 IR SOLN
Status: DC | PRN
  Administered 2022-07-04: 2 mL

## 2022-07-04 MED ORDER — DEXAMETHASONE SODIUM PHOSPHATE 10 MG/ML IJ SOLN
INTRAMUSCULAR | Status: AC
  Filled 2022-07-04: qty 1

## 2022-07-04 MED ORDER — PROPOFOL 10 MG/ML IV BOLUS
INTRAVENOUS | Status: DC | PRN
  Administered 2022-07-04: 150 mg via INTRAVENOUS
  Administered 2022-07-04: 70 mg via INTRAVENOUS

## 2022-07-04 MED ORDER — FENTANYL CITRATE (PF) 100 MCG/2ML IJ SOLN
INTRAMUSCULAR | Status: AC
  Filled 2022-07-04: qty 2

## 2022-07-04 MED ORDER — LACTATED RINGERS IV SOLN: INTRAVENOUS | Status: DC

## 2022-07-04 MED ORDER — LIDOCAINE HCL (CARDIAC) PF 100 MG/5ML IV SOSY
PREFILLED_SYRINGE | INTRAVENOUS | Status: DC | PRN
  Administered 2022-07-04: 100 mg via INTRAVENOUS

## 2022-07-04 SURGICAL SUPPLY — 30 items
BLADE SURG MINI STRL (BLADE) ×1 IMPLANT
BNDG ELASTIC 3X5.8 VLCR NS LF (GAUZE/BANDAGES/DRESSINGS) ×1 IMPLANT
BNDG ESMARCH 4 X 12 STRL LF (GAUZE/BANDAGES/DRESSINGS) ×1
BNDG ESMARCH 4X12 STRL LF (GAUZE/BANDAGES/DRESSINGS) ×1 IMPLANT
CHLORAPREP W/TINT 26 (MISCELLANEOUS) ×1 IMPLANT
CUFF TOURN SGL QUICK 18X4 (TOURNIQUET CUFF) IMPLANT
CUFF TOURN SGL QUICK 24 (TOURNIQUET CUFF)
CUFF TRNQT CYL 24X4X16.5-23 (TOURNIQUET CUFF) IMPLANT
DRSG GAUZE FLUFF 36X18 (GAUZE/BANDAGES/DRESSINGS) IMPLANT
ELECT REM PT RETURN 9FT ADLT (ELECTROSURGICAL) ×1
ELECTRODE REM PT RTRN 9FT ADLT (ELECTROSURGICAL) ×1 IMPLANT
GAUZE SPONGE 4X4 12PLY STRL (GAUZE/BANDAGES/DRESSINGS) ×1 IMPLANT
GAUZE XEROFORM 1X8 LF (GAUZE/BANDAGES/DRESSINGS) ×1 IMPLANT
GLOVE BIO SURGEON STRL SZ8 (GLOVE) ×1 IMPLANT
GOWN STRL REUS W/ TWL LRG LVL3 (GOWN DISPOSABLE) ×1 IMPLANT
GOWN STRL REUS W/TWL LRG LVL3 (GOWN DISPOSABLE) ×1
GOWN STRL REUS W/TWL LRG LVL4 (GOWN DISPOSABLE) ×1 IMPLANT
KIT TURNOVER KIT A (KITS) ×1 IMPLANT
MANIFOLD NEPTUNE II (INSTRUMENTS) ×1 IMPLANT
NS IRRIG 500ML POUR BTL (IV SOLUTION) ×1 IMPLANT
PACK EXTREMITY ARMC (MISCELLANEOUS) ×1 IMPLANT
PAD CAST 3X4 CTTN HI CHSV (CAST SUPPLIES) ×1 IMPLANT
PAD PREP 24X41 OB/GYN DISP (PERSONAL CARE ITEMS) ×1 IMPLANT
PADDING CAST COTTON 3X4 STRL (CAST SUPPLIES)
STOCKINETTE 48X4 2 PLY STRL (GAUZE/BANDAGES/DRESSINGS) ×1 IMPLANT
STOCKINETTE BIAS CUT 4 980044 (GAUZE/BANDAGES/DRESSINGS) IMPLANT
STOCKINETTE STRL 4IN 9604848 (GAUZE/BANDAGES/DRESSINGS) ×1 IMPLANT
SUT ETHILON 4 0 P 3 18 (SUTURE) ×1 IMPLANT
TRAP FLUID SMOKE EVACUATOR (MISCELLANEOUS) ×1 IMPLANT
WATER STERILE IRR 500ML POUR (IV SOLUTION) ×1 IMPLANT

## 2022-07-04 NOTE — H&P (Signed)
THE PATIENT WAS SEEN PRIOR TO SURGERY TODAY.  HISTORY, ALLERGIES, HOME MEDICATIONS AND OPERATIVE PROCEDURE WERE REVIEWED. RISKS AND BENEFITS OF SURGERY DISCUSSED WITH PATIENT AGAIN.  NO CHANGES FROM INITIAL HISTORY AND PHYSICAL NOTED.    

## 2022-07-04 NOTE — Transfer of Care (Signed)
Immediate Anesthesia Transfer of Care Note  Patient: Gabrielle Parsons  Procedure(s) Performed: INCISION AND REMOVAL OF FOREIGN BODY, SUBCUTANEOUS TISSUES; SIMPLE (Right)  Patient Location: PACU  Anesthesia Type:General  Level of Consciousness: drowsy  Airway & Oxygen Therapy: Patient Spontanous Breathing and Patient connected to face mask oxygen  Post-op Assessment: Report given to RN and Post -op Vital signs reviewed and stable  Post vital signs: Reviewed and stable  Last Vitals:  Vitals Value Taken Time  BP 100/65 07/04/22 1027  Temp 35.8 1027  Pulse 72 07/04/22 1028  Resp 12 07/04/22 1028  SpO2 100 % 07/04/22 1028  Vitals shown include unvalidated device data.  Last Pain:  Vitals:   07/04/22 0831  TempSrc: Oral  PainSc: 0-No pain         Complications: No notable events documented.

## 2022-07-04 NOTE — Op Note (Signed)
07/04/2022  10:29 AM  PATIENT:  Gabrielle Parsons    PRE-OPERATIVE DIAGNOSIS:  S60.551A Superficial foreign body of right hand, initial encounter versus possible palmar fibromatosis  POST-OPERATIVE DIAGNOSIS: Localized Dupuytren's disease right palm  PROCEDURE: Excision of Dupuytren's disease right palm with A1 pulley release  SURGEON:  Park Breed, MD  COMPLICATIONS:   None  EBL: None  TOURNIQUET TIME:   26 MIN  ANESTHESIA: General LMA  PREOPERATIVE INDICATIONS:  [name] is a  37 y.o. female with a diagnosis of S60.551A Superficial foreign body of right hand, initial encounter who failed conservative measures and elected for surgical management.  She had a history of the hand going through glass door a year ago and had a piece of glass removed from a different part of the hand previously.  She had thickening of the skin in the palm and pain there.  Differential diagnosis was Dupuytren's disease as well.  The risks benefits and alternatives were discussed with the patient preoperatively including but not limited to the risks of infection, bleeding, nerve injury, cardiopulmonary complications, the need for revision surgery, among others, and the patient was willing to proceed.  OPERATIVE IMPLANTS: None  OPERATIVE FINDINGS: No foreign body was identified.  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  OPERATIVE PROCEDURE: The patient was brought to the operating room where he underwent satisfactory general LMA anesthesia in the supine position.  The  arm was prepped and draped in a sterile fashion.  Esmarch was applied and tourniquet inflated to 250 mmHg.  A zigzag incision was made in the palm proximal to the right finger over the thickened area of tissue where she felt that a piece of glass may have entered.  The thickening was localized and was also consistent with palmar  fibromatosis. Blunt dissection was carried out proximally and distally and then coming toward the center.  There is definitely thickening of the palmar fascia proximally.  This was carefully peeled off the undersurface of the skin.  This was carried out from ulnar to radial.  The palmar fascia was definitely thickened here.  I was not able to identify any foreign body.  Using careful dissection the entirety of the thickened area of palmar fascia was removed.  The A1 pulley was in the field and was released to prevent for further triggering.  Neurovascular bundle was intact.  Fascia was removed down to the level of the tendon sheath.  There is good motion of finger.  The tissue was amputated proximally at an area of normal-appearing fascia.  The wound was then irrigated and closed with 5-0 nylon suture.  Half percent Marcaine was placed in the wound.  A dry sterile dressing applied.  Tourniquet was deflated with good return of blood flow to the hand.  Sponge and needle counts were correct.  Patient was awakened and taken to recovery in good condition.   Park Breed, M.D.

## 2022-07-04 NOTE — Anesthesia Preprocedure Evaluation (Signed)
Anesthesia Evaluation  Patient identified by MRN, date of birth, ID band Patient awake    Reviewed: Allergy & Precautions, NPO status , Patient's Chart, lab work & pertinent test results  History of Anesthesia Complications (+) PONV and history of anesthetic complications  Airway Mallampati: II  TM Distance: >3 FB Neck ROM: full    Dental  (+) Dental Advidsory Given, Teeth Intact   Pulmonary neg shortness of breath, neg sleep apnea, neg COPD, neg recent URI, Not current smoker, former smoker          Cardiovascular (-) hypertension(-) angina (-) Past MI, (-) Cardiac Stents and (-) CHF (-) dysrhythmias (-) Valvular Problems/Murmurs     Neuro/Psych neg Seizures PSYCHIATRIC DISORDERS Anxiety        GI/Hepatic Neg liver ROS,GERD  ,,  Endo/Other  neg diabetes    Renal/GU negative Renal ROS     Musculoskeletal   Abdominal   Peds  Hematology   Anesthesia Other Findings Past Medical History: 03/14/2015: Anxiety disorder 11/2018: BRCA negative     Comment:  MyRisk neg; PJKD=32.6%/ZTIWPYKDX=83.3% No date: Complication of anesthesia 11/2018: Family history of ovarian cancer     Comment:  MyRisk neg 03/14/2015: GERD (gastroesophageal reflux disease) No date: PONV (postoperative nausea and vomiting) 03/14/2015: Scoliosis   Reproductive/Obstetrics                             Anesthesia Physical Anesthesia Plan  ASA: 2  Anesthesia Plan: General   Post-op Pain Management:    Induction: Intravenous  PONV Risk Score and Plan: 4 or greater and Ondansetron, Dexamethasone, Midazolam and Propofol infusion  Airway Management Planned: LMA  Additional Equipment:   Intra-op Plan:   Post-operative Plan: Extubation in OR  Informed Consent: I have reviewed the patients History and Physical, chart, labs and discussed the procedure including the risks, benefits and alternatives for the proposed  anesthesia with the patient or authorized representative who has indicated his/her understanding and acceptance.       Plan Discussed with:   Anesthesia Plan Comments:         Anesthesia Quick Evaluation

## 2022-07-04 NOTE — Discharge Instructions (Signed)

## 2022-07-04 NOTE — Anesthesia Procedure Notes (Signed)
Procedure Name: LMA Insertion Date/Time: 07/04/2022 9:29 AM  Performed by: Cammie Sickle, CRNAPre-anesthesia Checklist: Patient identified, Patient being monitored, Timeout performed, Emergency Drugs available and Suction available Patient Re-evaluated:Patient Re-evaluated prior to induction Oxygen Delivery Method: Circle system utilized Preoxygenation: Pre-oxygenation with 100% oxygen Induction Type: IV induction Ventilation: Mask ventilation without difficulty LMA: LMA inserted LMA Size: 3.0 Tube type: Oral Number of attempts: 1 Placement Confirmation: positive ETCO2 and breath sounds checked- equal and bilateral Tube secured with: Tape Dental Injury: Teeth and Oropharynx as per pre-operative assessment

## 2022-07-05 LAB — SURGICAL PATHOLOGY

## 2022-07-05 NOTE — Anesthesia Postprocedure Evaluation (Signed)
Anesthesia Post Note  Patient: Gabrielle Parsons  Procedure(s) Performed: INCISION AND REMOVAL OF FOREIGN BODY, SUBCUTANEOUS TISSUES; SIMPLE (Right)  Patient location during evaluation: PACU Anesthesia Type: General Level of consciousness: awake and alert Pain management: pain level controlled Vital Signs Assessment: post-procedure vital signs reviewed and stable Respiratory status: spontaneous breathing, nonlabored ventilation, respiratory function stable and patient connected to nasal cannula oxygen Cardiovascular status: blood pressure returned to baseline and stable Postop Assessment: no apparent nausea or vomiting Anesthetic complications: no   No notable events documented.   Last Vitals:  Vitals:   07/04/22 1100 07/04/22 1122  BP: 115/77 104/74  Pulse: 76 79  Resp: 16 16  Temp: 36.6 C 37.1 C  SpO2: 100% 100%    Last Pain:  Vitals:   07/04/22 1122  TempSrc: Temporal  PainSc: 2                  Martha Clan

## 2022-07-10 NOTE — Progress Notes (Signed)
I,[name],acting as a Education administrator for [employer], PA-C.,have documented all relevant documentation on the behalf of Mikey Kirschner, PA-C,as directed by  Mikey Kirschner, PA-C while in the presence of Mikey Kirschner, PA-C.   Established patient visit   Patient: Gabrielle Parsons   DOB: 1985-10-28   37 y.o. Female  MRN: [id number] Visit Date: 07/11/2022  Today's healthcare provider: Mikey Kirschner, PA-C   Cc. New patient, concerns over vaginal discharge, dysuria, anxiety, adhd  Subjective    HPI  History of anxiety, adhd -Patient reports a history of ADHD diagnosed at 10 was on Ritalin, reports symptoms came back around the age of 53 and she was on Vyvanse for a few years.  She stopped taking her medication and has felt well until now.  She also reports a history of being diagnosed with anxiety but denies medication for anxiety.  She is followed intermittently with a therapist on and off currently not seeing anyone regularly.  -She reports her current symptoms are difficulty concentrating at work and at home, lack of focus reports starting things but cannot finish them.  Feeling fidgety.  Reports over the last few months also having more anxious thoughts that she is not able to let go of.  Denies issues with sleeping reports sleeping 7 to 8 hours a night maybe wakes up once a night to use the restroom.  Denies a history of depression or depression thoughts or feelings currently.  Vaginal discharge -Patient reports a white discharge and burning sensation around her vaginal area and while urinating for the last 3 weeks.  When it first started 3 weeks ago she took 3 to 4 days of an old antibiotic, Bactrim.  Denies resolution of symptoms.  Reports heavy periods, LMP is now, 07/11/2022.  History of left tubal ligation at 20 due to a cyst.  Family history of heart disease, ovarian cancer in a maternal grandmother, but patient is BRCA1/2 negative.  Patient is not a current smoker.  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  Medications: Outpatient Medications Prior to Visit  Medication Sig   gabapentin (NEURONTIN) 400 MG capsule Take 1 capsule (400 mg total) by mouth 3 (three) times daily.   meloxicam (MOBIC) 15 MG tablet Take 1 tablet (15 mg total) by mouth daily.   traMADol (ULTRAM) 50 MG tablet Take 1 tablet (50 mg total) by mouth every 6 (six) hours as needed for moderate pain.   [DISCONTINUED] acetaminophen (TYLENOL) 500 MG tablet Take 1,000 mg by mouth every 6 (six) hours as needed. (Patient not taking: Reported on 07/11/2022)   [DISCONTINUED] meloxicam (MOBIC) 15 MG tablet Take 15 mg by mouth daily.   No facility-administered medications prior to visit.    Review of Systems  Constitutional:  Negative for fatigue and fever.  Respiratory:  Negative for cough and shortness of breath.   Cardiovascular:  Negative for chest pain and leg swelling.  Gastrointestinal:  Negative for abdominal pain.  Neurological:  Negative for dizziness and headaches.  Psychiatric/Behavioral:  Positive for behavioral problems. The patient is nervous/anxious.       Objective    BP 123/75 (BP Location: Left Arm, Patient Position: Sitting, Cuff Size: Normal)   Pulse 98   Ht 5' 10"$  (1.778 m)   Wt 179 lb 1.6 oz (81.2 kg)   LMP 06/19/2022   SpO2 100%   BMI 25.70 kg/m  Blood pressure 123/75, pulse 98, height 5' 10"$  (  1.778 m), weight 179 lb 1.6 oz (81.2 kg), last menstrual period 06/19/2022, SpO2 100 %.   Physical Exam Constitutional:      General: She is awake.     Appearance: She is well-developed.  HENT:     Head: Normocephalic.  Eyes:     Conjunctiva/sclera: Conjunctivae normal.  Cardiovascular:     Rate and Rhythm: Normal rate and regular rhythm.     Heart sounds: Normal heart sounds.  Pulmonary:     Effort: Pulmonary effort is normal.     Breath sounds: Normal breath sounds.  Skin:    General: Skin is warm.  Neurological:      Mental Status: She is alert and oriented to person, place, and time.  Psychiatric:        Attention and Perception: Attention normal.        Mood and Affect: Mood normal.        Speech: Speech normal.        Behavior: Behavior is cooperative.     Results for orders placed or performed in visit on 07/11/22  POCT Urinalysis Dipstick  Result Value Ref Range   Color, UA     Clarity, UA     Glucose, UA Negative Negative   Bilirubin, UA Negative    Ketones, UA Negative    Spec Grav, UA 1.020 1.010 - 1.025   Blood, UA Trace    pH, UA 6.0 5.0 - 8.0   Protein, UA Negative Negative   Urobilinogen, UA 0.2 0.2 or 1.0 E.U./dL   Nitrite, UA Negative    Leukocytes, UA Negative Negative   Appearance     Odor      Assessment & Plan     Problem List Items Addressed This Visit       Other   Anxiety disorder - Primary    Discussed that adhd and anxiety symptoms can overlap Encouraged pt to return to therapy Discussed psychiatry, but pt would prefer to stay with pcp and therapy for now  Advised that starting adhd medication can sometimes worsen anxiety F/u 4 weeks      Attention deficit hyperactivity disorder (ADHD)    See anxiety d/o Pt has long standing self reported history of adhd Discussed trying stimulant vs non stimulant Pt would prefer trying adderall again as she has taken this , discussed trial of adderall 10 mg xr  F/u 4 weeks      Relevant Medications   amphetamine-dextroamphetamine (ADDERALL XR) 10 MG 24 hr capsule   Dysuria    UA neg leukocytes Urine culture ordered Self swab for yeast, bv, trich. Pt declines further sti testing      Relevant Orders   POCT Urinalysis Dipstick (Completed)   Cervicovaginal ancillary only   Urine Culture   Other Visit Diagnoses     Menorrhagia with regular cycle       Relevant Orders   Iron, TIBC and Ferritin Panel   Overweight (BMI 25.0-29.9)       Relevant Orders   CBC w/Diff/Platelet   Comprehensive Metabolic Panel  (CMET)   HgB A1c   TSH        Return in about 4 weeks (around 08/08/2022) for anxiety, adhd.      I, Mikey Kirschner, PA-C have reviewed all documentation for this visit. The documentation on  07/11/22 for the exam, diagnosis, procedures, and orders are all accurate and complete.  Mikey Kirschner, PA-C [facility] [address] [place], [place], [address] Office: [phone]  Fax: [place]

## 2022-07-11 ENCOUNTER — Ambulatory Visit: Admit: 2022-07-11 | Payer: 59 | Admitting: Specialist

## 2022-07-11 ENCOUNTER — Ambulatory Visit: Payer: 59 | Admitting: Physician Assistant

## 2022-07-11 ENCOUNTER — Other Ambulatory Visit (HOSPITAL_COMMUNITY)
Admission: RE | Admit: 2022-07-11 | Discharge: 2022-07-11 | Disposition: A | Discharge status: Home / Self Care | Payer: 59 | Source: Ambulatory Visit | Attending: Physician Assistant | Admitting: Physician Assistant

## 2022-07-11 ENCOUNTER — Encounter: Payer: Self-pay | Admitting: Physician Assistant

## 2022-07-11 VITALS — BP 123/75 | HR 98 | Ht 70.0 in | Wt 179.1 lb

## 2022-07-11 DIAGNOSIS — F909 Attention-deficit hyperactivity disorder, unspecified type: Secondary | ICD-10-CM

## 2022-07-11 DIAGNOSIS — F419 Anxiety disorder, unspecified: Secondary | ICD-10-CM

## 2022-07-11 DIAGNOSIS — N920 Excessive and frequent menstruation with regular cycle: Secondary | ICD-10-CM | POA: Diagnosis not present

## 2022-07-11 DIAGNOSIS — R300 Dysuria: Secondary | ICD-10-CM | POA: Diagnosis not present

## 2022-07-11 DIAGNOSIS — E663 Overweight: Secondary | ICD-10-CM

## 2022-07-11 LAB — POCT URINALYSIS DIPSTICK
Bilirubin, UA: NEGATIVE
Glucose, UA: NEGATIVE
Ketones, UA: NEGATIVE
Leukocytes, UA: NEGATIVE
Nitrite, UA: NEGATIVE
Protein, UA: NEGATIVE
Spec Grav, UA: 1.02 (ref 1.010–1.025)
Urobilinogen, UA: 0.2 E.U./dL
pH, UA: 6 (ref 5.0–8.0)

## 2022-07-11 SURGERY — EXCISION, GANGLION CYST, WRIST
Anesthesia: General | Site: Wrist | Laterality: Right

## 2022-07-11 MED ORDER — AMPHETAMINE-DEXTROAMPHET ER 10 MG PO CP24: 10.0000 mg | ORAL_CAPSULE | Freq: Every day | ORAL | 0 refills | Status: DC

## 2022-07-12 ENCOUNTER — Encounter: Payer: Self-pay | Admitting: Physician Assistant

## 2022-07-12 DIAGNOSIS — F909 Attention-deficit hyperactivity disorder, unspecified type: Secondary | ICD-10-CM | POA: Insufficient documentation

## 2022-07-12 DIAGNOSIS — R300 Dysuria: Secondary | ICD-10-CM | POA: Insufficient documentation

## 2022-07-12 IMAGING — DX DG FOOT COMPLETE 3+V*R*
3 series · 3 of 3 positions shown · non-contrast
Comparison: None.

CLINICAL DATA: Fall down stairs and landed on glass. Right foot
pain and puncture wounds. Initial encounter.

EXAM:
RIGHT FOOT COMPLETE - 3+ VIEW

[foot ap]
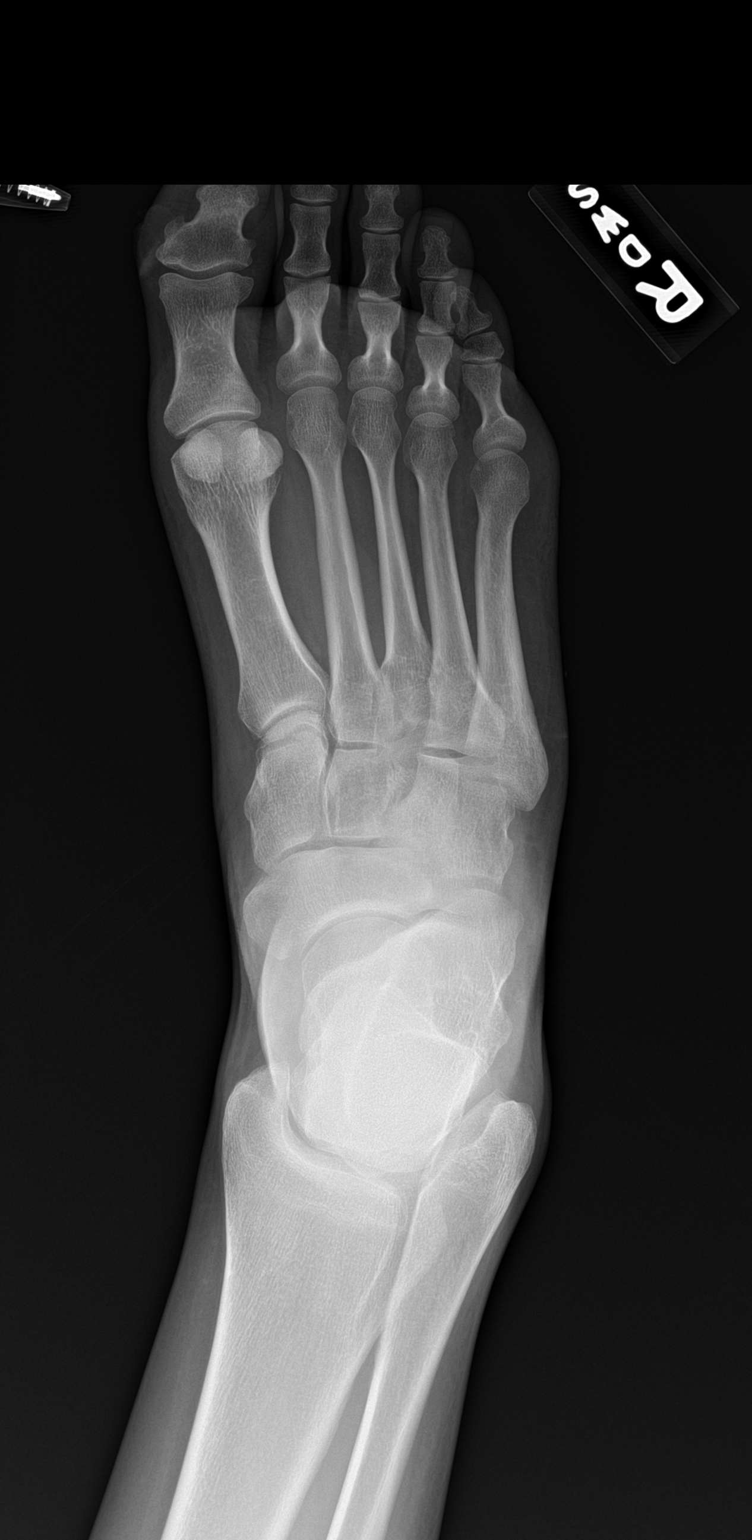

[foot obl]
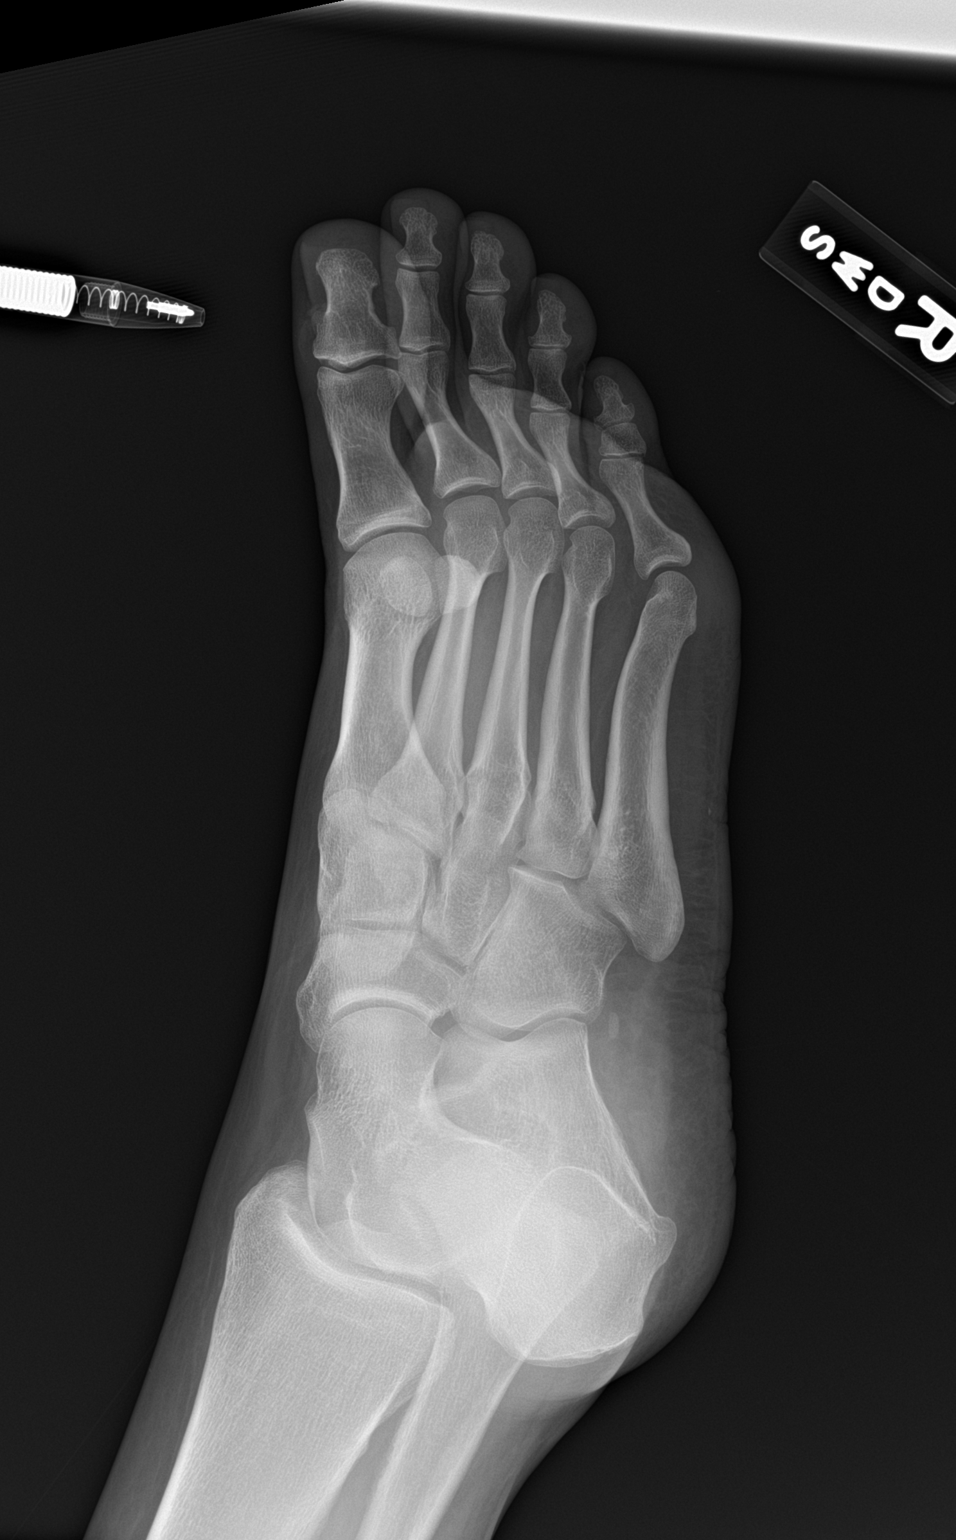

[foot lat]
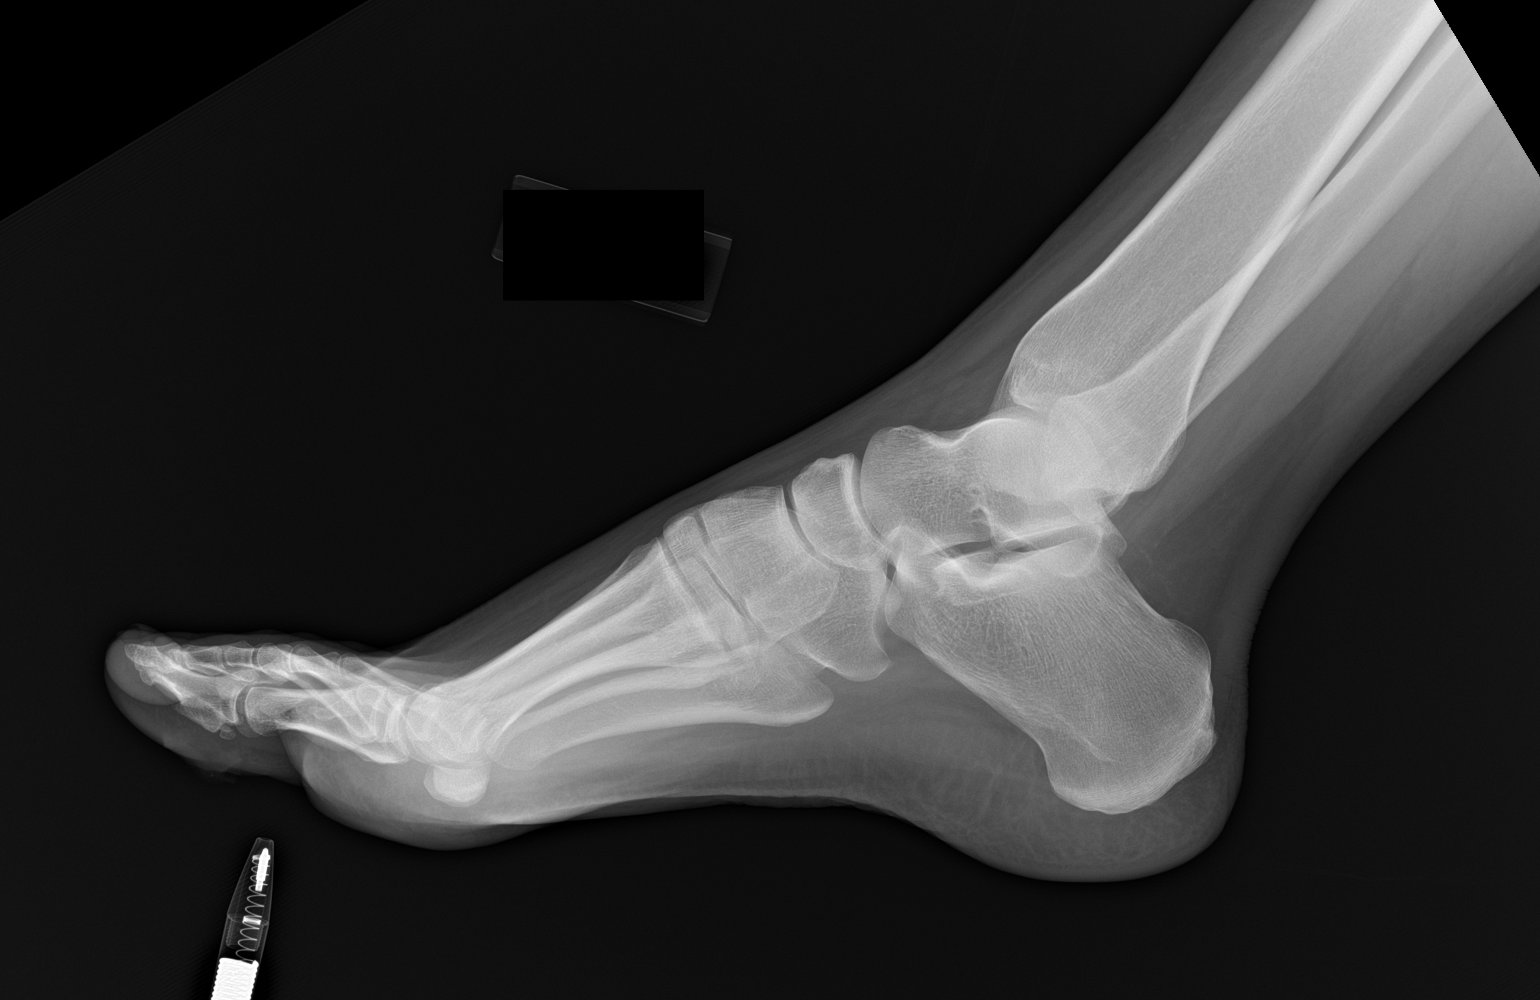

[3 of 3 positions shown; findings below may reference images not displayed]

FINDINGS: There is no evidence of fracture or dislocation. There is no
evidence of arthropathy or other focal bone abnormality. An oblong
radiodensity is seen along the medial aspect of the interphalangeal
joint of the great toe, suspicious for a foreign body such as glass.
IMPRESSION: Radiodensity along the medial aspect of the interphalangeal joint of
the great toe, suspicious for non metallic foreign body. No evidence
of fracture.

## 2022-07-12 NOTE — Assessment & Plan Note (Addendum)
UA neg leukocytes Urine culture ordered Self swab for yeast, bv, trich. Pt declines further sti testing

## 2022-07-12 NOTE — Assessment & Plan Note (Signed)
Discussed that adhd and anxiety symptoms can overlap Encouraged pt to return to therapy Discussed psychiatry, but pt would prefer to stay with pcp and therapy for now  Advised that starting adhd medication can sometimes worsen anxiety F/u 4 weeks

## 2022-07-12 NOTE — Assessment & Plan Note (Signed)
See anxiety d/o Pt has long standing self reported history of adhd Discussed trying stimulant vs non stimulant Pt would prefer trying adderall again as she has taken this , discussed trial of adderall 10 mg xr  F/u 4 weeks

## 2022-07-15 ENCOUNTER — Other Ambulatory Visit: Payer: Self-pay | Admitting: Physician Assistant

## 2022-07-15 LAB — URINE CULTURE

## 2022-07-15 LAB — CERVICOVAGINAL ANCILLARY ONLY
Bacterial Vaginitis (gardnerella): NEGATIVE
Candida Glabrata: NEGATIVE
Candida Vaginitis: NEGATIVE
Comment: NEGATIVE
Comment: NEGATIVE
Comment: NEGATIVE
Comment: NEGATIVE
Trichomonas: NEGATIVE

## 2022-07-15 MED ORDER — NITROFURANTOIN MONOHYD MACRO 100 MG PO CAPS: 100.0000 mg | ORAL_CAPSULE | Freq: Two times a day (BID) | ORAL | 0 refills | Status: AC

## 2022-07-18 ENCOUNTER — Encounter: Payer: Self-pay | Admitting: Specialist

## 2022-07-24 NOTE — Progress Notes (Deleted)
   Argentina Ponder Anetra Czerwinski,acting as a scribe for Mikey Kirschner, PA-C.,have documented all relevant documentation on the behalf of Mikey Kirschner, PA-C,as directed by  Mikey Kirschner, PA-C while in the presence of Mikey Kirschner, PA-C.     Established patient visit   Patient: Gabrielle Parsons   DOB: 28-May-1986   37 y.o. Female  MRN: [id number] Visit Date: 07/25/2022  Today's healthcare provider: Mikey Kirschner, PA-C   No chief complaint on file.  Subjective    HPI  Anxiety, Follow-up  She was last seen for anxiety 2 weeks ago. Changes made at last visit include none.   She reports {excellent/good/fair/poor:19665} compliance with treatment. She reports {good/fair/poor:18685} tolerance of treatment. She {is/is not:21021397} having side effects. {document side effects if present:1}  She feels her anxiety is {Desc; severity:60313} and {improved/worse/unchanged:3041574} since last visit.  Symptoms: {Yes/No:20286} chest pain {Yes/No:20286} difficulty concentrating  {Yes/No:20286} dizziness {Yes/No:20286} fatigue  {Yes/No:20286} feelings of losing control {Yes/No:20286} insomnia  {Yes/No:20286} irritable {Yes/No:20286} palpitations  {Yes/No:20286} panic attacks {Yes/No:20286} racing thoughts  {Yes/No:20286} shortness of breath {Yes/No:20286} sweating  {Yes/No:20286} tremors/shakes    GAD-7 Results    07/11/2022    1:56 PM  GAD-7 Generalized Anxiety Disorder Screening Tool  1. Feeling Nervous, Anxious, or on Edge 3  2. Not Being Able to Stop or Control Worrying 2  3. Worrying Too Much About Different Things 3  4. Trouble Relaxing 3  5. Being So Restless it's Hard To Sit Still 3  6. Becoming Easily Annoyed or Irritable 3  7. Feeling Afraid As If Something Awful Might Happen 0  Total GAD-7 Score 17  Difficulty At Work, Home, or Getting  Along With Others? Somewhat difficult    PHQ-9 Scores    07/11/2022    2:04 PM  PHQ9 SCORE ONLY  PHQ-9 Total Score 16     --------------------------------------------------------------------------------------------------- ADHD, follow-up  Patient is a 37 year old female who presents for follow up of ADHD.  She was seen 2 weeks ago.  Treatment changes include starting the patient on Adderall again.   Medications: Outpatient Medications Prior to Visit  Medication Sig   amphetamine-dextroamphetamine (ADDERALL XR) 10 MG 24 hr capsule Take 1 capsule (10 mg total) by mouth daily.   gabapentin (NEURONTIN) 400 MG capsule Take 1 capsule (400 mg total) by mouth 3 (three) times daily.   meloxicam (MOBIC) 15 MG tablet Take 1 tablet (15 mg total) by mouth daily.   traMADol (ULTRAM) 50 MG tablet Take 1 tablet (50 mg total) by mouth every 6 (six) hours as needed for moderate pain.   No facility-administered medications prior to visit.    Review of Systems  {Labs  Heme  Chem  Endocrine  Serology  Results Review (optional):23779}   Objective    LMP 06/19/2022  {Show previous vital signs (optional):23777}  Physical Exam  ***  No results found for any visits on 07/25/22.  Assessment & Plan     ***  No follow-ups on file.      {provider attestation***:1}   Mikey Kirschner, PA-C  [place] [phone] (phone) [phone] (fax)  [place]

## 2022-07-25 ENCOUNTER — Ambulatory Visit: Payer: BLUE CROSS/BLUE SHIELD | Admitting: Physician Assistant

## 2022-09-26 ENCOUNTER — Ambulatory Visit: Payer: 59 | Admitting: Physician Assistant

## 2022-09-26 ENCOUNTER — Encounter: Payer: Self-pay | Admitting: Physician Assistant

## 2022-09-26 VITALS — BP 107/75 | HR 91 | Ht 70.0 in | Wt 158.8 lb

## 2022-09-26 DIAGNOSIS — F909 Attention-deficit hyperactivity disorder, unspecified type: Secondary | ICD-10-CM

## 2022-09-26 DIAGNOSIS — T8549XA Other mechanical complication of breast prosthesis and implant, initial encounter: Secondary | ICD-10-CM

## 2022-09-26 DIAGNOSIS — N644 Mastodynia: Secondary | ICD-10-CM | POA: Diagnosis not present

## 2022-09-26 MED ORDER — AMPHETAMINE-DEXTROAMPHETAMINE 10 MG PO TABS: 10.0000 mg | ORAL_TABLET | Freq: Two times a day (BID) | ORAL | 0 refills | Status: DC

## 2022-09-26 NOTE — Assessment & Plan Note (Signed)
Changing to adderall 10 mg instant BID.  Again advised to monitor her anxiety symptoms during this time. I do think it is likely she will need meds for anxiety control (vs therapy but pt not interested)

## 2022-09-26 NOTE — Progress Notes (Signed)
I,[name],acting as a Neurosurgeon for [employer], PA-C.,have documented all relevant documentation on the behalf of [name], PA-C,as directed by  [name], PA-C while in the presence of [name], PA-C.   Established patient visit   Patient: Gabrielle Parsons   DOB: 1985/11/28   37 y.o. Female  MRN: [id number] Visit Date: 09/26/2022  Today's healthcare provider: [name], PA-C   Cc. ADHD , anxiety f/u, breast implant questions  Subjective    HPI  Pt was advised by her plastic surgeon 5 years after her implants to do a screening MRI for implant compromise. She reports some left sided breast pain and shape changes, the left breast is smaller than the right. She is concerned of a rupture or leak.   Follow up for ADHD  The patient was last seen for this 11 weeks ago. Changes made at last visit include trial of adderall 10 mg xr . She felt it helped, took around 6:30 am, but stopped being effective around 11-12:00.   She reports excellent compliance with treatment. She feels that condition is Unchanged. She is not having side effects.   -----------------------------------------------------------------------------------------  Anxiety, Follow-up  She was last seen for anxiety 11 weeks ago. Changes made at last visit include encouraged pt to return to therapy .  She feels her anxiety is moderate and Unchanged since last visit.  Symptoms: Yes chest pain Yes difficulty concentrating  No dizziness Yes fatigue  No feelings of losing control No insomnia  No irritable No palpitations  No panic attacks No racing thoughts  No shortness of breath No sweating  No tremors/shakes    GAD-7 Results    09/26/2022    1:30 PM 07/11/2022    1:56 PM  GAD-7 Generalized Anxiety Disorder Screening Tool  1. Feeling Nervous, Anxious, or on Edge 0 3  2. Not Being Able to Stop or Control Worrying 1 2  3. Worrying Too Much About Different Things 1 3  4. Trouble  Relaxing 2 3  5. Being So Restless it's Hard To Sit Still 2 3  6. Becoming Easily Annoyed or Irritable 1 3  7. Feeling Afraid As If Something Awful Might Happen 0 0  Total GAD-7 Score 7 17  Difficulty At Work, Home, or Getting  Along With Others? Not difficult at all Somewhat difficult    PHQ-9 Scores    09/26/2022    1:29 PM 07/11/2022    2:04 PM  PHQ9 SCORE ONLY  PHQ-9 Total Score 7 16    ---------------------------------------------------------------------------------------------------   Medications: Outpatient Medications Prior to Visit  Medication Sig   [DISCONTINUED] amphetamine-dextroamphetamine (ADDERALL XR) 10 MG 24 hr capsule Take 1 capsule (10 mg total) by mouth daily.   [DISCONTINUED] gabapentin (NEURONTIN) 400 MG capsule Take 1 capsule (400 mg total) by mouth 3 (three) times daily. (Patient not taking: Reported on 09/26/2022)   [DISCONTINUED] meloxicam (MOBIC) 15 MG tablet Take 1 tablet (15 mg total) by mouth daily. (Patient not taking: Reported on 09/26/2022)   [DISCONTINUED] traMADol (ULTRAM) 50 MG tablet Take 1 tablet (50 mg total) by mouth every 6 (six) hours as needed for moderate pain. (Patient not taking: Reported on 09/26/2022)   No facility-administered medications prior to visit.    Review of Systems  Constitutional:  Negative for fatigue and fever.  Respiratory:  Negative for cough and shortness of breath.   Cardiovascular:  Negative for chest pain and leg swelling.  Gastrointestinal:  Negative for abdominal pain.  Neurological:  Negative for dizziness and headaches.  Psychiatric/Behavioral:  The patient is nervous/anxious.       Objective    BP 107/75 (BP Location: Left Arm, Patient Position: Sitting, Cuff Size: Normal)   Pulse 91   Ht  (1.778 m)   Wt 158 lb 12.8 oz (72 kg)   SpO2 98%   BMI 22.79 kg/m    Physical Exam Vitals reviewed.  Constitutional:      Appearance: She is not ill-appearing.  HENT:     Head: Normocephalic.  Eyes:      Conjunctiva/sclera: Conjunctivae normal.  Cardiovascular:     Rate and Rhythm: Normal rate.  Pulmonary:     Effort: Pulmonary effort is normal. No respiratory distress.  Neurological:     General: No focal deficit present.     Mental Status: She is alert and oriented to person, place, and time.  Psychiatric:        Mood and Affect: Mood normal.        Behavior: Behavior normal.      No results found for any visits on 09/26/22.  Assessment & Plan     Problem List Items Addressed This Visit       Other   Attention deficit hyperactivity disorder (ADHD) - Primary    Changing to adderall 10 mg instant BID.  Again advised to monitor her anxiety symptoms during this time. 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 Advised if her insurance denies she will likely need to go back to her breast surgeon  - MR BREAST BILATERAL WO CONTRAST; Future  Return in about 4 weeks (around 10/24/2022), or if symptoms worsen or fail to improve, for anxiety;adhd.      I, [name], PA-C have reviewed all documentation for this visit. The documentation on  09/26/22   for the exam, diagnosis, procedures, and orders are all accurate and complete.  [name], PA-C [facility] [address] [place], [place], [address] Office: [phone] Fax: [phone]   [facility]

## 2022-10-21 ENCOUNTER — Other Ambulatory Visit: Payer: Self-pay | Admitting: Physician Assistant

## 2022-10-21 ENCOUNTER — Encounter: Payer: Self-pay | Admitting: Physician Assistant

## 2022-10-21 ENCOUNTER — Ambulatory Visit: Payer: 59 | Admitting: Physician Assistant

## 2022-10-21 VITALS — BP 128/88 | HR 120 | Temp 97.7°F | Resp 16 | Ht 70.0 in | Wt 152.4 lb

## 2022-10-21 DIAGNOSIS — M546 Pain in thoracic spine: Secondary | ICD-10-CM | POA: Diagnosis not present

## 2022-10-21 DIAGNOSIS — H6991 Unspecified Eustachian tube disorder, right ear: Secondary | ICD-10-CM

## 2022-10-21 DIAGNOSIS — F419 Anxiety disorder, unspecified: Secondary | ICD-10-CM | POA: Diagnosis not present

## 2022-10-21 DIAGNOSIS — N644 Mastodynia: Secondary | ICD-10-CM

## 2022-10-21 DIAGNOSIS — F902 Attention-deficit hyperactivity disorder, combined type: Secondary | ICD-10-CM

## 2022-10-21 MED ORDER — AMPHETAMINE-DEXTROAMPHETAMINE 10 MG PO TABS: 10.0000 mg | ORAL_TABLET | Freq: Two times a day (BID) | ORAL | 0 refills | Status: DC

## 2022-10-21 MED ORDER — METHYLPREDNISOLONE 4 MG PO TBPK: ORAL_TABLET | ORAL | 0 refills | Status: DC

## 2022-10-21 NOTE — Assessment & Plan Note (Signed)
Pt feels stable w/ adderall 10 mg BID Will continue

## 2022-10-21 NOTE — Assessment & Plan Note (Signed)
Improved with ADHD treatment.  Will monitor

## 2022-10-21 NOTE — Progress Notes (Signed)
I,Gabrielle  Parsons,acting as a Neurosurgeon for [employer], PA-C.,have documented all relevant documentation on the behalf of Gabrielle Ferguson, PA-C,as directed by  Gabrielle Ferguson, PA-C while in the presence of Gabrielle Ferguson, PA-C.   Established patient visit   Patient: Gabrielle Parsons   DOB: 01-Nov-1985   37 y.o. Female  MRN: [id number] Visit Date: 10/21/2022  Today's healthcare provider: [name], PA-C   Chief Complaint  Patient presents with   ADHD   Anxiety   Ear Fullness    Patient reports feels like a bubble in her ear, it causes hearing to decrease and states it will pop on it own, but that it comes and goes and it has been going on for about a month now.   Shoulder Pain    Patient reports she has a pinched nerve on her left shoulder area for about a month ago. States she sees a [facility] but they advised for her to mention it to her primary.     Subjective    HPI Pt reports a pinched nerve in her left shoulder x 1-2 months that waxes and wanes, she has been using heat/ice, seeing a personal trainer and a chiropractor with no continued relief. Using ibuprofen OTC.  She also reports a right ear fullness w/ popping intermittently x 1 mo. H/o tympanostomy tubes  Anxiety, Follow-up  She was last seen for anxiety 1 months ago. Changes made at last visit include no changes.   She reports good compliance with treatment. She reports good tolerance of treatment. She is not having side effects.  She feels her anxiety is moderate and Improved since last visit.  Symptoms: No chest pain No difficulty concentrating  No dizziness No fatigue  No feelings of losing control No insomnia  No irritable No palpitations  No panic attacks No racing thoughts  No shortness of breath No sweating  No tremors/shakes    GAD-7 Results    09/26/2022    1:30 PM 07/11/2022    1:56 PM  GAD-7 Generalized Anxiety Disorder Screening Tool  1. Feeling Nervous, Anxious, or on Edge 0 3  2. Not  Being Able to Stop or Control Worrying 1 2  3. Worrying Too Much About Different Things 1 3  4. Trouble Relaxing 2 3  5. Being So Restless it's Hard To Sit Still 2 3  6. Becoming Easily Annoyed or Irritable 1 3  7. Feeling Afraid As If Something Awful Might Happen 0 0  Total GAD-7 Score 7 17  Difficulty At Work, Home, or Getting  Along With Others? Not difficult at all Somewhat difficult    PHQ-9 Scores    10/21/2022    8:43 AM 09/26/2022    1:29 PM 07/11/2022    2:04 PM  PHQ9 SCORE ONLY  PHQ-9 Total Score 5 7 16     ADHD better, able to focus; Reports her chest pain has resolved. ---------------------------------------------------------------------------------------------------   Medications: Outpatient Medications Prior to Visit  Medication Sig   [DISCONTINUED] amphetamine-dextroamphetamine (ADDERALL) 10 MG tablet Take 1 tablet (10 mg total) by mouth 2 (two) times daily.   No facility-administered medications prior to visit.    Review of Systems  Constitutional:  Negative for fatigue and fever.  HENT:  Positive for ear pain and hearing loss.   Respiratory:  Negative for cough and shortness of breath.   Cardiovascular:  Negative for chest pain and leg swelling.  Gastrointestinal:  Negative for abdominal pain.  Musculoskeletal:  Positive for back  pain.  Neurological:  Negative for dizziness and headaches.  Psychiatric/Behavioral:  The patient is nervous/anxious.       Objective    BP 128/88 (BP Location: Right Arm, Patient Position: Sitting, Cuff Size: Normal)   Pulse (!) 120   Temp 97.7 F (36.5 C) (Oral)   Resp 16   Ht 5\' 10"  (1.778 m)   Wt 152 lb 6.4 oz (69.1 kg)   LMP 09/30/2022 (Approximate)   SpO2 100%   BMI 21.87 kg/m    Physical Exam Vitals reviewed.  Constitutional:      Appearance: She is not ill-appearing.  HENT:     Head: Normocephalic.     Left Ear: Tympanic membrane normal.     Ears:     Comments: Some serous fluid R TM no erythema Eyes:      Conjunctiva/sclera: Conjunctivae normal.  Cardiovascular:     Rate and Rhythm: Normal rate.  Pulmonary:     Effort: Pulmonary effort is normal. No respiratory distress.  Musculoskeletal:     Comments: Tenderness along L scaphoid border  Neurological:     General: No focal deficit present.     Mental Status: She is alert and oriented to person, place, and time.  Psychiatric:        Mood and Affect: Mood normal.        Behavior: Behavior normal.     No results found for any visits on 10/21/22.  Assessment & Plan     Problem List Items Addressed This Visit       Other   Anxiety disorder    Improved with ADHD treatment.  Will monitor       Attention deficit hyperactivity disorder (ADHD) - Primary    Pt feels stable w/ adderall 10 mg BID Will continue      Relevant Medications   amphetamine-dextroamphetamine (ADDERALL) 10 MG tablet   Other Visit Diagnoses     Acute left-sided thoracic back pain       Relevant Medications   methylPREDNISolone (MEDROL DOSEPAK) 4 MG TBPK tablet   Eustachian tube dysfunction, right          3. Acute left-sided thoracic back pain D/t heavy OTC NSAID use, will rx steroid pack. Advised to stop with adjustments/massage and rest it; using topical/ice heat.   - methylPREDNISolone (MEDROL DOSEPAK) 4 MG TBPK tablet; Take 6 pills on day 1, 5 pills on day 2, 4 pills on day 3, 3 pills on day 4, 2 pills on day 5 and 1 pill on day 6  Dispense: 1 each; Refill: 0  4. Eustachian tube dysfunction, right Rec. Otc antihistamines and intranasal steroids  Return in about 6 months (around 04/23/2023) for chronic conditions.     I, Gabrielle Ferguson, PA-C have reviewed all documentation for this visit. The documentation on  10/21/22   for the exam, diagnosis, procedures, and orders are all accurate and complete.  Gabrielle Ferguson, PA-C [facility] [address] [place], [place], [address] Office: [phone] Fax: [phone]   [facility]

## 2022-11-04 ENCOUNTER — Ambulatory Visit: Payer: Self-pay | Admitting: *Deleted

## 2022-11-04 ENCOUNTER — Other Ambulatory Visit: Payer: Self-pay | Admitting: Physician Assistant

## 2022-11-04 DIAGNOSIS — F418 Other specified anxiety disorders: Secondary | ICD-10-CM

## 2022-11-04 MED ORDER — LORAZEPAM 0.5 MG PO TABS: ORAL_TABLET | ORAL | 0 refills | Status: DC

## 2022-11-04 NOTE — Telephone Encounter (Signed)
Summary: rx req   The patient will be having an MRI on 11/06/22 and would like to be prescribed something to help with their nerves  Please contact further when possible         Reason for Disposition . Prescription request for new medicine (not a refill)  Answer Assessment - Initial Assessment Questions 1. NAME of MEDICINE: "What medicine(s) are you calling about?"     Patient is requesting something for her nerves- xanax used in the past 2. QUESTION: "What is your question?" (e.g., double dose of medicine, side effect)     Provider advised patient to call when scheduled for Rx 3. PRESCRIBER: "Who prescribed the medicine?" Reason: if prescribed by specialist, call should be referred to that group.     PCP ordered MRI  [facility] patient drug store  Protocols used: Medication Question Call-A-AH

## 2022-11-04 NOTE — Telephone Encounter (Signed)
  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

## 2022-11-06 ENCOUNTER — Ambulatory Visit
Admission: RE | Admit: 2022-11-06 | Discharge: 2022-11-06 | Disposition: A | Discharge status: Home / Self Care | Payer: 59 | Source: Ambulatory Visit | Attending: Physician Assistant | Admitting: Physician Assistant

## 2022-11-06 DIAGNOSIS — N644 Mastodynia: Secondary | ICD-10-CM | POA: Insufficient documentation

## 2022-11-08 ENCOUNTER — Other Ambulatory Visit: Payer: Self-pay | Admitting: Physician Assistant

## 2022-11-08 DIAGNOSIS — N644 Mastodynia: Secondary | ICD-10-CM

## 2022-11-08 DIAGNOSIS — Z9882 Breast implant status: Secondary | ICD-10-CM

## 2022-11-15 ENCOUNTER — Ambulatory Visit: Payer: 59

## 2022-12-09 ENCOUNTER — Other Ambulatory Visit: Payer: Self-pay | Admitting: Physician Assistant

## 2022-12-09 DIAGNOSIS — F902 Attention-deficit hyperactivity disorder, combined type: Secondary | ICD-10-CM

## 2022-12-10 MED ORDER — AMPHETAMINE-DEXTROAMPHETAMINE 10 MG PO TABS: 10.0000 mg | ORAL_TABLET | Freq: Two times a day (BID) | ORAL | 0 refills | Status: DC

## 2023-01-14 ENCOUNTER — Other Ambulatory Visit: Payer: Self-pay | Admitting: Physician Assistant

## 2023-01-14 DIAGNOSIS — F902 Attention-deficit hyperactivity disorder, combined type: Secondary | ICD-10-CM

## 2023-01-15 ENCOUNTER — Telehealth: Payer: Self-pay

## 2023-01-15 MED ORDER — AMPHETAMINE-DEXTROAMPHETAMINE 10 MG PO TABS: 10.0000 mg | ORAL_TABLET | Freq: Two times a day (BID) | ORAL | 0 refills | Status: DC

## 2023-01-15 NOTE — Telephone Encounter (Signed)
LVMCTB. CRM created. Ok for [facility] to advise is patient returns call

## 2023-02-12 ENCOUNTER — Other Ambulatory Visit: Payer: Self-pay | Admitting: Physician Assistant

## 2023-02-12 DIAGNOSIS — F902 Attention-deficit hyperactivity disorder, combined type: Secondary | ICD-10-CM

## 2023-02-13 ENCOUNTER — Ambulatory Visit: Payer: 59 | Admitting: Physician Assistant

## 2023-02-13 ENCOUNTER — Encounter: Payer: Self-pay | Admitting: Physician Assistant

## 2023-02-13 DIAGNOSIS — F902 Attention-deficit hyperactivity disorder, combined type: Secondary | ICD-10-CM | POA: Diagnosis not present

## 2023-02-13 MED ORDER — AMPHETAMINE-DEXTROAMPHETAMINE 10 MG PO TABS: 10.0000 mg | ORAL_TABLET | Freq: Two times a day (BID) | ORAL | 0 refills | Status: DC

## 2023-02-13 NOTE — Progress Notes (Signed)
Established patient visit  Patient: Gabrielle Parsons   DOB: May 27, 1986   37 y.o. Female  MRN: [id number] Visit Date: 02/13/2023  Today's healthcare provider: Debera Lat, PA-C   Chief Complaint  Patient presents with   ADHD    Patient feels she is doing well.  She is tolerating her medications well.   Subjective     Discussed the use of AI scribe software for clinical note transcription with the patient, who gave verbal consent to proceed.  History of Present Illness   The patient, with a history of ADHD, has been seeing a therapist for anxiety management. They have been taking a stimulant medication for ADHD since February of this year. The patient reports that the medication has been helpful in managing their symptoms, particularly in their new, fast-paced job. They have noticed a significant improvement in their ability to stay focused and on task when taking the medication. The patient also mentions a past issue with chest problems, which have since resolved. They have been seeing a therapist for a few years now, and the therapy sessions have been beneficial in managing their anxiety.           02/13/2023    2:32 PM 10/21/2022    8:43 AM 09/26/2022    1:29 PM  Depression screen PHQ 2/9  Decreased Interest 1 1 1   Down, Depressed, Hopeless 0 1 0  PHQ - 2 Score 1 2 1   Altered sleeping 0 0 0  Tired, decreased energy 1 1 1   Change in appetite 0 0 0  Feeling bad or failure about yourself  0 0 0  Trouble concentrating 1 1 3   Moving slowly or fidgety/restless 0 1 2  Suicidal thoughts 0 0 0  PHQ-9 Score 3 5 7   Difficult doing work/chores Not difficult at all Somewhat difficult Not difficult at all      02/13/2023    2:32 PM 09/26/2022    1:30 PM 07/11/2022    1:56 PM  GAD 7 : Generalized Anxiety Score  Nervous, Anxious, on Edge 0 0 3  Control/stop worrying 1 1 2   Worry too much - different things 1 1 3   Trouble relaxing 2 2 3   Restless 1 2 3   Easily annoyed or irritable 1 1 3    Afraid - awful might happen 0 0 0  Total GAD 7 Score 6 7 17   Anxiety Difficulty Somewhat difficult Not difficult at all Somewhat difficult    Medications: Outpatient Medications Prior to Visit  Medication Sig   [DISCONTINUED] amphetamine-dextroamphetamine (ADDERALL) 10 MG tablet Take 1 tablet (10 mg total) by mouth 2 (two) times daily.   LORazepam (ATIVAN) 0.5 MG tablet Take 1-2 tablets 30 minutes before procedure (Patient not taking: Reported on 02/13/2023)   [DISCONTINUED] methylPREDNISolone (MEDROL DOSEPAK) 4 MG TBPK tablet Take 6 pills on day 1, 5 pills on day 2, 4 pills on day 3, 3 pills on day 4, 2 pills on day 5 and 1 pill on day 6 (Patient not taking: Reported on 02/13/2023)   No facility-administered medications prior to visit.    Review of Systems  All other systems reviewed and are negative.  Except see HPI       Objective    BP 123/85 (BP Location: Right Arm, Patient Position: Sitting, Cuff Size: Normal)   Pulse (!) 109   Temp 97.7 F (36.5 C) (Oral)   Ht 5\' 10"  (1.778 m)   Wt 154 lb (69.9 kg)   SpO2 99%  BMI 22.10 kg/m     Physical Exam Constitutional:      General: She is not in acute distress.    Appearance: Normal appearance.  HENT:     Head: Normocephalic.  Pulmonary:     Effort: Pulmonary effort is normal. No respiratory distress.  Neurological:     Mental Status: She is alert and oriented to person, place, and time. Mental status is at baseline.      No results found for any visits on 02/13/23.  Assessment & Plan        Attention Deficit Hyperactivity Disorder (ADHD) Reports difficulty focusing and staying on task at work, especially in a new, fast-paced job. Reports improvement with medication. Currently taking stimulant medication twice daily. Also engaged in therapy with a private therapist. -Continue current medication regimen. -Check in one month to assess progress and adjust treatment as necessary.  Anxiety Reports working on  addressing anxiety triggers and causes. Currently seeing a private therapist for management. -Continue current therapy sessions. -Check in one month to assess progress and adjust treatment as necessary.     Return in about 4 weeks (around 03/13/2023) for chronic disease f/u.     The patient was advised to call back or seek an in-person evaluation if the symptoms worsen or if the condition fails to improve as anticipated.  I discussed the assessment and treatment plan with the patient. The patient was provided an opportunity to ask questions and all were answered. The patient agreed with the plan and demonstrated an understanding of the instructions.  I, Debera Lat, PA-C have reviewed all documentation for this visit. The documentation on  02/13/23 for the exam, diagnosis, procedures, and orders are all accurate and complete.  Debera Lat, [facility], MMS [facility] [phone] (phone) [phone] (fax)  [facility]

## 2023-03-13 ENCOUNTER — Encounter: Payer: Self-pay | Admitting: Physician Assistant

## 2023-03-13 ENCOUNTER — Ambulatory Visit: Payer: 59 | Admitting: Physician Assistant

## 2023-03-13 VITALS — BP 111/77 | HR 95 | Ht 70.0 in | Wt 162.3 lb

## 2023-03-13 DIAGNOSIS — Z9889 Other specified postprocedural states: Secondary | ICD-10-CM

## 2023-03-13 DIAGNOSIS — F419 Anxiety disorder, unspecified: Secondary | ICD-10-CM

## 2023-03-13 DIAGNOSIS — F902 Attention-deficit hyperactivity disorder, combined type: Secondary | ICD-10-CM

## 2023-03-13 MED ORDER — AMPHETAMINE-DEXTROAMPHETAMINE 10 MG PO TABS: 10.0000 mg | ORAL_TABLET | Freq: Two times a day (BID) | ORAL | 0 refills | Status: DC

## 2023-03-13 MED ORDER — HYDROXYZINE HCL 25 MG PO TABS: 25.0000 mg | ORAL_TABLET | Freq: Three times a day (TID) | ORAL | 1 refills | Status: DC | PRN

## 2023-03-13 NOTE — Progress Notes (Signed)
Established patient visit  Patient: Gabrielle Parsons   DOB: [birth year]   37 y.o. Female  MRN: [id number] Visit Date: 03/13/2023  Today's healthcare provider: Debera Lat, PA-C   Chief Complaint  Patient presents with   Medical Management of Chronic Issues    ADHD follow-up   Subjective     Discussed the use of AI scribe software for clinical note transcription with the patient, who gave verbal consent to proceed.  History of Present Illness   The patient, with a history of ADHD and anxiety, reports increased stress levels due to a more demanding work environment. The patient notes that their ADHD symptoms and anxiety have been heightened, but it is unclear whether this is due to the increased work stress or a change in their condition. The patient is currently on medication for ADHD and is attending therapy sessions once a week. The patient also reports having undergone a tummy tuck surgery at the end of May. Recently, the patient has been experiencing discomfort around the incision area, describing it as a feeling of pressure and noticing small incisions appearing. The patient denies any urinary issues, chest pain, shortness of breath, or rapid heart beating.        03/13/2023    1:12 PM 02/13/2023    2:32 PM 09/26/2022    1:30 PM 07/11/2022    1:56 PM  GAD 7 : Generalized Anxiety Score  Nervous, Anxious, on Edge 1 0 0 3  Control/stop worrying 1 1 1 2   Worry too much - different things 1 1 1 3   Trouble relaxing 1 2 2 3   Restless 1 1 2 3   Easily annoyed or irritable 1 1 1 3   Afraid - awful might happen 0 0 0 0  Total GAD 7 Score 6 6 7 17   Anxiety Difficulty Somewhat difficult Somewhat difficult Not difficult at all Somewhat difficult          03/13/2023    1:12 PM 02/13/2023    2:32 PM 10/21/2022    8:43 AM  Depression screen PHQ 2/9  Decreased Interest 0 1 1  Down, Depressed, Hopeless 0 0 1  PHQ - 2 Score 0 1 2  Altered sleeping 0 0 0  Tired, decreased energy 1 1 1    Change in appetite 1 0 0  Feeling bad or failure about yourself  0 0 0  Trouble concentrating 1 1 1   Moving slowly or fidgety/restless 0 0 1  Suicidal thoughts 0 0 0  PHQ-9 Score 3 3 5   Difficult doing work/chores Somewhat difficult Not difficult at all Somewhat difficult      03/13/2023    1:12 PM 02/13/2023    2:32 PM 09/26/2022    1:30 PM 07/11/2022    1:56 PM  GAD 7 : Generalized Anxiety Score  Nervous, Anxious, on Edge 1 0 0 3  Control/stop worrying 1 1 1 2   Worry too much - different things 1 1 1 3   Trouble relaxing 1 2 2 3   Restless 1 1 2 3   Easily annoyed or irritable 1 1 1 3   Afraid - awful might happen 0 0 0 0  Total GAD 7 Score 6 6 7 17   Anxiety Difficulty Somewhat difficult Somewhat difficult Not difficult at all Somewhat difficult    Medications: Outpatient Medications Prior to Visit  Medication Sig   [DISCONTINUED] amphetamine-dextroamphetamine (ADDERALL) 10 MG tablet Take 1 tablet (10 mg total) by mouth 2 (two) times daily.   No facility-administered medications  prior to visit.    Review of Systems  All other systems reviewed and are negative.  Except see HPI       Objective    BP 111/77   Pulse 95   Ht 5\' 10"  (1.778 m)   Wt 162 lb 4.8 oz (73.6 kg)   SpO2 100%   BMI 23.29 kg/m     Physical Exam Constitutional:      General: She is not in acute distress.    Appearance: Normal appearance.  HENT:     Head: Normocephalic.  Eyes:     General: No scleral icterus.       Right eye: No discharge.        Left eye: No discharge.     Extraocular Movements: Extraocular movements intact.     Conjunctiva/sclera: Conjunctivae normal.     Pupils: Pupils are equal, round, and reactive to light.  Cardiovascular:     Rate and Rhythm: Normal rate and regular rhythm.  Pulmonary:     Effort: Pulmonary effort is normal. No respiratory distress.     Breath sounds: Normal breath sounds.  Abdominal:     General: Abdomen is flat. Bowel sounds are normal.      Palpations: Abdomen is soft.  Neurological:     Mental Status: She is alert and oriented to person, place, and time. Mental status is at baseline.      No results found for any visits on 03/13/23.  Assessment & Plan        Anxiety and ADHD Increased stress at work contributing to heightened symptoms. Currently attending weekly therapy sessions and working on behavioral modifications. Medication regimen includes Adderall and Vistaril. -Continue current therapy sessions. -Read recommended book on stress management techniques. -Start Hydroxyzine (Vistaril) 25mg  at bedtime for anxiety control, with a one-month supply and one refill .  Post-Abdominoplasty Care Patient reports pressure and small incisions at the site of a tummy tuck performed in May. No pain, urination issues, discharge, or leg swelling reported. -Continue post-operative care, including regular visits to the cosmetic center. -Use over-the-counter gels to aid scar resolution.  Weight Management Patient reports weight gain after tummy tuck, despite regular exercise. Binge eating at night identified as a potential issue. -Encouraged to maintain strict diet and exercise regimen to prevent further weight gain and potential complications with tummy tuck results. Patient was informed that  Weight gain can potentially affect the tension and stress on the abdominal scar, which might contribute to increased pain or discomfort in the area.  Increased abdominal girth from weight gain could strain the healing tissues and incisions, potentially leading to increased pain sensations.  Changes in body weight could alter the distribution and load on the surgical site, potentially impacting pain levels.  Follow-up in two months unless patient experiences palpitations or other concerning symptoms. Continue Adderall as prescribed.     Return in about 2 months (around 05/13/2023) for chronic disease f/u.     The patient was advised to call  back or seek an in-person evaluation if the symptoms worsen or if the condition fails to improve as anticipated.  I discussed the assessment and treatment plan with the patient. The patient was provided an opportunity to ask questions and all were answered. The patient agreed with the plan and demonstrated an understanding of the instructions.  I, Debera Lat, PA-C have reviewed all documentation for this visit. The documentation on  03/13/23 for the exam, diagnosis, procedures, and orders are all accurate and complete.  Debera Lat, [facility], MMS [facility] [phone] (phone) [phone] (fax)  [facility]

## 2023-04-10 ENCOUNTER — Other Ambulatory Visit: Payer: Self-pay | Admitting: Physician Assistant

## 2023-04-10 DIAGNOSIS — F902 Attention-deficit hyperactivity disorder, combined type: Secondary | ICD-10-CM

## 2023-04-14 MED ORDER — AMPHETAMINE-DEXTROAMPHETAMINE 10 MG PO TABS: 10.0000 mg | ORAL_TABLET | Freq: Two times a day (BID) | ORAL | 0 refills | Status: DC

## 2023-05-11 NOTE — Progress Notes (Unsigned)
Established patient visit  Patient: Gabrielle Parsons   DOB: 1986-02-21   37 y.o. Female  MRN: [id number] Visit Date: 05/13/2023  Today's healthcare provider: Debera Lat, PA-C   No chief complaint on file.  Subjective    HPI  *** Discussed the use of AI scribe software for clinical note transcription with the patient, who gave verbal consent to proceed.  History of Present Illness               03/13/2023    1:12 PM 02/13/2023    2:32 PM 10/21/2022    8:43 AM  Depression screen PHQ 2/9  Decreased Interest 0 1 1  Down, Depressed, Hopeless 0 0 1  PHQ - 2 Score 0 1 2  Altered sleeping 0 0 0  Tired, decreased energy 1 1 1   Change in appetite 1 0 0  Feeling bad or failure about yourself  0 0 0  Trouble concentrating 1 1 1   Moving slowly or fidgety/restless 0 0 1  Suicidal thoughts 0 0 0  PHQ-9 Score 3 3 5   Difficult doing work/chores Somewhat difficult Not difficult at all Somewhat difficult      03/13/2023    1:12 PM 02/13/2023    2:32 PM 09/26/2022    1:30 PM 07/11/2022    1:56 PM  GAD 7 : Generalized Anxiety Score  Nervous, Anxious, on Edge 1 0 0 3  Control/stop worrying 1 1 1 2   Worry too much - different things 1 1 1 3   Trouble relaxing 1 2 2 3   Restless 1 1 2 3   Easily annoyed or irritable 1 1 1 3   Afraid - awful might happen 0 0 0 0  Total GAD 7 Score 6 6 7 17   Anxiety Difficulty Somewhat difficult Somewhat difficult Not difficult at all Somewhat difficult    Medications: Outpatient Medications Prior to Visit  Medication Sig  . amphetamine-dextroamphetamine (ADDERALL) 10 MG tablet Take 1 tablet (10 mg total) by mouth 2 (two) times daily.  . hydrOXYzine (ATARAX) 25 MG tablet Take 1 tablet (25 mg total) by mouth 3 (three) times daily as needed.   No facility-administered medications prior to visit.    Review of Systems  All other systems reviewed and are negative. Except see HPI   {Insert previous labs (optional):23779} {See past labs  Heme  Chem   Endocrine  Serology  Results Review (optional):1}   Objective    There were no vitals taken for this visit. {Insert last BP/Wt (optional):23777}{See vitals history (optional):1}   Physical Exam Vitals reviewed.  Constitutional:      General: She is not in acute distress.    Appearance: Normal appearance. She is well-developed. She is not diaphoretic.  HENT:     Head: Normocephalic and atraumatic.  Eyes:     General: No scleral icterus.    Conjunctiva/sclera: Conjunctivae normal.  Neck:     Thyroid: No thyromegaly.  Cardiovascular:     Rate and Rhythm: Normal rate and regular rhythm.     Pulses: Normal pulses.     Heart sounds: Normal heart sounds. No murmur heard. Pulmonary:     Effort: Pulmonary effort is normal. No respiratory distress.     Breath sounds: Normal breath sounds. No wheezing, rhonchi or rales.  Musculoskeletal:     Cervical back: Neck supple.     Right lower leg: No edema.     Left lower leg: No edema.  Lymphadenopathy:     Cervical: No cervical adenopathy.  Skin:    General: Skin is warm and dry.     Findings: No rash.  Neurological:     Mental Status: She is alert and oriented to person, place, and time. Mental status is at baseline.  Psychiatric:        Mood and Affect: Mood normal.        Behavior: Behavior normal.     No results found for any visits on 05/13/23.  Assessment & Plan    *** Assessment and Plan              No follow-ups on file.      [facility]

## 2023-05-13 ENCOUNTER — Ambulatory Visit: Payer: 59 | Admitting: Physician Assistant

## 2023-05-13 ENCOUNTER — Encounter: Payer: Self-pay | Admitting: Physician Assistant

## 2023-05-13 VITALS — BP 106/64 | HR 81 | Ht 70.0 in | Wt 158.9 lb

## 2023-05-13 DIAGNOSIS — F419 Anxiety disorder, unspecified: Secondary | ICD-10-CM | POA: Diagnosis not present

## 2023-05-13 DIAGNOSIS — Z9889 Other specified postprocedural states: Secondary | ICD-10-CM | POA: Diagnosis not present

## 2023-05-13 DIAGNOSIS — F902 Attention-deficit hyperactivity disorder, combined type: Secondary | ICD-10-CM

## 2023-05-13 MED ORDER — AMPHETAMINE-DEXTROAMPHETAMINE 10 MG PO TABS: 10.0000 mg | ORAL_TABLET | Freq: Two times a day (BID) | ORAL | 0 refills | Status: DC

## 2023-05-14 MED ORDER — AMPHETAMINE-DEXTROAMPHETAMINE 10 MG PO TABS: 10.0000 mg | ORAL_TABLET | Freq: Two times a day (BID) | ORAL | 0 refills | Status: DC

## 2023-06-09 ENCOUNTER — Other Ambulatory Visit: Payer: Self-pay | Admitting: Physician Assistant

## 2023-06-09 DIAGNOSIS — F902 Attention-deficit hyperactivity disorder, combined type: Secondary | ICD-10-CM

## 2023-06-09 MED ORDER — AMPHETAMINE-DEXTROAMPHETAMINE 10 MG PO TABS: 10.0000 mg | ORAL_TABLET | Freq: Two times a day (BID) | ORAL | 0 refills | Status: DC

## 2023-06-25 ENCOUNTER — Ambulatory Visit: Payer: 59 | Admitting: Family Medicine

## 2023-06-25 ENCOUNTER — Ambulatory Visit: Payer: 59 | Admitting: Physician Assistant

## 2023-06-25 ENCOUNTER — Encounter: Payer: Self-pay | Admitting: Family Medicine

## 2023-06-25 VITALS — BP 125/81 | HR 117 | Temp 98.6°F | Resp 18 | Ht 70.0 in | Wt 159.0 lb

## 2023-06-25 DIAGNOSIS — J101 Influenza due to other identified influenza virus with other respiratory manifestations: Secondary | ICD-10-CM

## 2023-06-25 DIAGNOSIS — J029 Acute pharyngitis, unspecified: Secondary | ICD-10-CM

## 2023-06-25 DIAGNOSIS — R509 Fever, unspecified: Secondary | ICD-10-CM | POA: Diagnosis not present

## 2023-06-25 DIAGNOSIS — R059 Cough, unspecified: Secondary | ICD-10-CM

## 2023-06-25 LAB — POC COVID19/FLU A&B COMBO
Covid Antigen, POC: NEGATIVE
Influenza A Antigen, POC: POSITIVE — AB
Influenza B Antigen, POC: NEGATIVE

## 2023-06-25 LAB — POCT RAPID STREP A (OFFICE): Rapid Strep A Screen: NEGATIVE

## 2023-06-25 MED ORDER — OSELTAMIVIR PHOSPHATE 75 MG PO CAPS: 75.0000 mg | ORAL_CAPSULE | Freq: Two times a day (BID) | ORAL | 0 refills | Status: DC

## 2023-06-25 NOTE — Progress Notes (Signed)
Established patient visit   Patient: Gabrielle Parsons   DOB: 25-Dec-1985   38 y.o. Female  MRN: [id number] Visit Date: 06/25/2023  Today's healthcare provider: Sherlyn Hay, DO   Chief Complaint  Patient presents with   Cough   Sore Throat    Feels like something stuck in throat   Fever   Subjective    Cough Associated symptoms include chills, a fever, rhinorrhea and a sore throat. Pertinent negatives include no ear pain.  Sore Throat  Associated symptoms include coughing. Pertinent negatives include no diarrhea, ear pain or vomiting.  Fever  Associated symptoms include coughing and a sore throat. Pertinent negatives include no diarrhea, ear pain, nausea or vomiting.   The patient, with a history of chronic constipation, presents with symptoms consistent with influenza. She reports a recent onset of fever, which has been somewhat managed with ibuprofen. The fever is associated with chills and periods of feeling excessively hot. She also reports significant nasal congestion and sinus symptoms.  The patient has been experiencing difficulty eating due to severe throat pain, and has been unable to consume even soft foods like chicken noodle soup. She reports episodes of significant nausea, which occur after severe episodes of coughing.  The patient denies any recent exposure to individuals with the flu. She has discontinued the use of Atarax.      Medications: Outpatient Medications Prior to Visit  Medication Sig   amphetamine-dextroamphetamine (ADDERALL) 10 MG tablet Take 1 tablet (10 mg total) by mouth 2 (two) times daily.   hydrOXYzine (ATARAX) 25 MG tablet Take 1 tablet (25 mg total) by mouth 3 (three) times daily as needed. (Patient not taking: Reported on 06/25/2023)   No facility-administered medications prior to visit.    Review of Systems  Constitutional:  Positive for appetite change, chills and fever.  HENT:  Positive for rhinorrhea, sinus pressure, sinus pain  and sore throat. Negative for ear pain.   Respiratory:  Positive for cough.   Gastrointestinal:  Positive for constipation (baseline). Negative for diarrhea, nausea and vomiting.        Objective    BP 125/81   Pulse (!) 117   Temp 98.6 F (37 C)   Resp 18   Ht 5\' 10"  (1.778 m)   Wt 159 lb (72.1 kg)   SpO2 100%   BMI 22.81 kg/m     Physical Exam Vitals reviewed.  Constitutional:      General: She is not in acute distress.    Appearance: Normal appearance. She is well-developed. She is not diaphoretic.  HENT:     Head: Normocephalic and atraumatic.     Right Ear: External ear normal.     Left Ear: External ear normal.     Nose: Nose normal.  Eyes:     General: No scleral icterus.    Conjunctiva/sclera: Conjunctivae normal.     Pupils: Pupils are equal, round, and reactive to light.  Cardiovascular:     Rate and Rhythm: Normal rate and regular rhythm.     Pulses: Normal pulses.     Heart sounds: Normal heart sounds. No murmur heard. Pulmonary:     Effort: Pulmonary effort is normal. No respiratory distress.     Breath sounds: Normal breath sounds. No wheezing or rales.  Musculoskeletal:     Cervical back: Neck supple.     Right lower leg: No edema.     Left lower leg: No edema.  Lymphadenopathy:  Cervical: No cervical adenopathy.  Skin:    General: Skin is warm and dry.     Findings: No rash.  Neurological:     Mental Status: She is alert.      Results for orders placed or performed in visit on 06/25/23  POC Covid19/Flu A&B Antigen  Result Value Ref Range   Influenza A Antigen, POC Positive (A) Negative   Influenza B Antigen, POC Negative Negative   Covid Antigen, POC Negative Negative  POCT rapid strep A  Result Value Ref Range   Rapid Strep A Screen Negative Negative    Assessment & Plan    Influenza A with respiratory manifestations -     Oseltamivir Phosphate; Take 1 capsule (75 mg total) by mouth 2 (two) times daily.  Dispense: 10 capsule;  Refill: 0  Sore throat -     POC Covid19/Flu A&B Antigen -     POCT rapid strep A  Cough, unspecified type -     POC Covid19/Flu A&B Antigen  Fever, unspecified fever cause -     POC Covid19/Flu A&B Antigen -     POCT rapid strep A   Influenza A Acute onset of fever, chills, nasal congestion, and cough starting on Monday. Symptoms include alternating chills and fever, cough-induced nausea, and difficulty eating due to throat pain. No known flu exposure. Taking ibuprofen, which may mask fever. No diarrhea, vomiting, or ear pain. Discussed Tamiflu, which can reduce flu duration by 1-2 days if started within 48 hours of symptom onset. Advised to monitor symptoms and report if not improving by Monday or if worsening. Consider antibiotics if symptoms worsen due to potential secondary bacterial infections. - Prescribe Tamiflu - Advise to monitor symptoms and report if not improving by Monday or if worsening - Consider antibiotics if symptoms worsen - Encourage intake of fluids like Pedialyte and Gatorade - Recommend easily digestible foods like chicken broth and soup  Return if symptoms worsen or fail to improve.      I discussed the assessment and treatment plan with the patient  The patient was provided an opportunity to ask questions and all were answered. The patient agreed with the plan and demonstrated an understanding of the instructions.   The patient was advised to call back or seek an in-person evaluation if the symptoms worsen or if the condition fails to improve as anticipated.    Sherlyn Hay, DO  [facility] [phone] (phone) [phone] (fax)  [facility]

## 2023-07-12 NOTE — Progress Notes (Signed)
 Established patient visit  Patient: Gabrielle Parsons   DOB: 12-25-1985   38 y.o. Female  MRN: [id number] Visit Date: 07/14/2023  Today's healthcare provider: Blane Bunting, PA-C   Chief Complaint  Patient presents with   ADHD    Patient here for 2 month follow up. Doing well and needs refill   Anxiety    Patient was placed on Hydroxyzine  at her last visit.  She states she did not see that it was helping her anxiety and she has stopped taking it.  She states the anxiety is worse in the afternoon.   Subjective     Discussed the use of AI scribe software for clinical note transcription with the patient, who gave verbal consent to proceed.  History of Present Illness   The patient, with a history of ADHD and anxiety, reports discontinuing hydroxyzine  due to perceived lack of efficacy. She describes her anxiety as manageable during the day, but increasing in the evening. She denies extreme anxiety, but acknowledges its presence. She has noticed that her anxiety and ADHD symptoms can exacerbate each other. She has been trying to reduce her workload to manage her anxiety, which has been somewhat successful. She has been seeing a therapist for years, but is currently looking for a new one as her current therapist is moving. She is considering online therapy, but has concerns about distractions at home. She is also managing ADHD with 10 mg of a stimulant twice daily, which she reports is effective. She has a history of weight loss surgery and is currently considering a revision surgery for the scar. She reports a recent weight gain, which she attributes to overeating at night. She exercises three days a week for 45 minutes with a trainer.         07/14/2023    9:21 AM 05/13/2023    1:15 PM 03/13/2023    1:12 PM  PHQ9 SCORE ONLY  PHQ-9 Total Score 1 0 3      07/14/2023    9:21 AM 05/13/2023    1:15 PM 03/13/2023    1:12 PM 02/13/2023    2:32 PM  GAD 7 : Generalized Anxiety Score  Nervous,  Anxious, on Edge 0 0 1 0  Control/stop worrying 1 1 1 1   Worry too much - different things 1 1 1 1   Trouble relaxing 0 1 1 2   Restless 0 1 1 1   Easily annoyed or irritable 0 0 1 1  Afraid - awful might happen 0 0 0 0  Total GAD 7 Score 2 4 6 6   Anxiety Difficulty Not difficult at all Somewhat difficult Somewhat difficult Somewhat difficult         07/14/2023    9:21 AM 05/13/2023    1:15 PM 03/13/2023    1:12 PM  Depression screen PHQ 2/9  Decreased Interest 0 0 0  Down, Depressed, Hopeless 0  0  PHQ - 2 Score 0 0 0  Altered sleeping 0  0  Tired, decreased energy 0  1  Change in appetite 1  1  Feeling bad or failure about yourself  0  0  Trouble concentrating 0  1  Moving slowly or fidgety/restless 0  0  Suicidal thoughts 0  0  PHQ-9 Score 1  3  Difficult doing work/chores Not difficult at all  Somewhat difficult      07/14/2023    9:21 AM 05/13/2023    1:15 PM 03/13/2023    1:12 PM 02/13/2023  2:32 PM  GAD 7 : Generalized Anxiety Score  Nervous, Anxious, on Edge 0 0 1 0  Control/stop worrying 1 1 1 1   Worry too much - different things 1 1 1 1   Trouble relaxing 0 1 1 2   Restless 0 1 1 1   Easily annoyed or irritable 0 0 1 1  Afraid - awful might happen 0 0 0 0  Total GAD 7 Score 2 4 6 6   Anxiety Difficulty Not difficult at all Somewhat difficult Somewhat difficult Somewhat difficult    Medications: Outpatient Medications Prior to Visit  Medication Sig   [DISCONTINUED] amphetamine -dextroamphetamine  (ADDERALL) 10 MG tablet Take 1 tablet (10 mg total) by mouth 2 (two) times daily.   hydrOXYzine  (ATARAX ) 25 MG tablet Take 1 tablet (25 mg total) by mouth 3 (three) times daily as needed.   [DISCONTINUED] oseltamivir  (TAMIFLU ) 75 MG capsule Take 1 capsule (75 mg total) by mouth 2 (two) times daily.   No facility-administered medications prior to visit.    Review of Systems  All other systems reviewed and are negative.  All negative Except see HPI        Objective    BP 113/81 (BP Location: Left Arm, Patient Position: Sitting, Cuff Size: Normal)   Pulse 94   Temp 98.4 F (36.9 C) (Oral)   Ht 5\' 10"  (1.778 m)   Wt 164 lb (74.4 kg)   SpO2 100%   BMI 23.53 kg/m     Physical Exam Vitals reviewed.  Constitutional:      General: She is not in acute distress.    Appearance: Normal appearance. She is well-developed. She is not diaphoretic.  HENT:     Head: Normocephalic and atraumatic.  Eyes:     General: No scleral icterus.    Conjunctiva/sclera: Conjunctivae normal.  Neck:     Thyroid: No thyromegaly.  Cardiovascular:     Rate and Rhythm: Normal rate and regular rhythm.     Pulses: Normal pulses.     Heart sounds: Normal heart sounds. No murmur heard. Pulmonary:     Effort: Pulmonary effort is normal. No respiratory distress.     Breath sounds: Normal breath sounds. No wheezing, rhonchi or rales.  Musculoskeletal:     Cervical back: Neck supple.     Right lower leg: No edema.     Left lower leg: No edema.  Lymphadenopathy:     Cervical: No cervical adenopathy.  Skin:    General: Skin is warm and dry.     Findings: No rash.  Neurological:     Mental Status: She is alert and oriented to person, place, and time. Mental status is at baseline.  Psychiatric:        Mood and Affect: Mood normal.        Behavior: Behavior normal.      No results found for any visits on 07/14/23.      Assessment and Plan    Anxiety Discontinued Hydroxyzine  due to perceived lack of efficacy. Anxiety is present but not severe, worse in the evenings. Currently managing with lifestyle modifications and therapy. Not interested in medication management at this time. -Continue current management strategies including therapy and lifestyle modifications. Reevaluate need for medication if anxiety worsens.  ADHD Currently managed with 10mg  of Adderall twice daily. No reported issues. -Continue Adderall 10mg  twice daily. Will recheck labs at the  follow-up Follow-up in 2 months  Weight Management Patient reports recent weight gain and struggles with diet, despite regular exercise.  History of plastic surgery for weight loss. -Encourage continued exercise and healthy diet. Consider additional support if weight gain continues.  Follow-up in 8 weeks, approximately one week before Adderall needs to be refilled.     No orders of the defined types were placed in this encounter.   Return in about 8 weeks (around 09/08/2023) for chronic disease f/u.   The patient was advised to call back or seek an in-person evaluation if the symptoms worsen or if the condition fails to improve as anticipated.  I discussed the assessment and treatment plan with the patient. The patient was provided an opportunity to ask questions and all were answered. The patient agreed with the plan and demonstrated an understanding of the instructions.  I, Shoni Quijas, PA-C have reviewed all documentation for this visit. The documentation on 07/14/2023  for the exam, diagnosis, procedures, and orders are all accurate and complete.  Blane Bunting, [facility], MMS [facility] [phone] (phone) [phone] (fax)  [facility]

## 2023-07-14 ENCOUNTER — Encounter: Payer: Self-pay | Admitting: Physician Assistant

## 2023-07-14 ENCOUNTER — Ambulatory Visit: Payer: Self-pay | Admitting: Physician Assistant

## 2023-07-14 VITALS — BP 113/81 | HR 94 | Temp 98.4°F | Ht 70.0 in | Wt 164.0 lb

## 2023-07-14 DIAGNOSIS — Z9889 Other specified postprocedural states: Secondary | ICD-10-CM | POA: Diagnosis not present

## 2023-07-14 DIAGNOSIS — F419 Anxiety disorder, unspecified: Secondary | ICD-10-CM

## 2023-07-14 DIAGNOSIS — F902 Attention-deficit hyperactivity disorder, combined type: Secondary | ICD-10-CM | POA: Diagnosis not present

## 2023-07-14 MED ORDER — AMPHETAMINE-DEXTROAMPHETAMINE 10 MG PO TABS: 10.0000 mg | ORAL_TABLET | Freq: Two times a day (BID) | ORAL | 0 refills | Status: DC

## 2023-08-12 ENCOUNTER — Other Ambulatory Visit: Payer: Self-pay | Admitting: Physician Assistant

## 2023-08-12 DIAGNOSIS — F902 Attention-deficit hyperactivity disorder, combined type: Secondary | ICD-10-CM

## 2023-08-19 ENCOUNTER — Other Ambulatory Visit: Payer: Self-pay | Admitting: Physician Assistant

## 2023-08-19 DIAGNOSIS — F902 Attention-deficit hyperactivity disorder, combined type: Secondary | ICD-10-CM

## 2023-08-19 MED ORDER — AMPHETAMINE-DEXTROAMPHETAMINE 10 MG PO TABS: 10.0000 mg | ORAL_TABLET | Freq: Two times a day (BID) | ORAL | 0 refills | Status: DC

## 2023-09-15 ENCOUNTER — Ambulatory Visit: Payer: 59 | Admitting: Physician Assistant

## 2023-09-19 ENCOUNTER — Other Ambulatory Visit: Payer: Self-pay | Admitting: Physician Assistant

## 2023-09-19 DIAGNOSIS — F902 Attention-deficit hyperactivity disorder, combined type: Secondary | ICD-10-CM

## 2023-09-19 MED ORDER — AMPHETAMINE-DEXTROAMPHETAMINE 10 MG PO TABS: 10.0000 mg | ORAL_TABLET | Freq: Two times a day (BID) | ORAL | 0 refills | Status: AC

## 2023-10-13 ENCOUNTER — Ambulatory Visit: Admitting: Physician Assistant

## 2023-12-17 ENCOUNTER — Ambulatory Visit: Payer: Self-pay

## 2023-12-17 ENCOUNTER — Ambulatory Visit
Admission: EM | Admit: 2023-12-17 | Discharge: 2023-12-17 | Disposition: A | Discharge status: Home / Self Care | Payer: Self-pay | Attending: Family Medicine | Admitting: Family Medicine

## 2023-12-17 DIAGNOSIS — W5501XA Bitten by cat, initial encounter: Secondary | ICD-10-CM

## 2023-12-17 DIAGNOSIS — Z203 Contact with and (suspected) exposure to rabies: Secondary | ICD-10-CM

## 2023-12-17 DIAGNOSIS — S61239A Puncture wound without foreign body of unspecified finger without damage to nail, initial encounter: Secondary | ICD-10-CM

## 2023-12-17 DIAGNOSIS — Z23 Encounter for immunization: Secondary | ICD-10-CM

## 2023-12-17 MED ORDER — FLUCONAZOLE 150 MG PO TABS: 150.0000 mg | ORAL_TABLET | Freq: Once | ORAL | 0 refills | Status: AC

## 2023-12-17 MED ORDER — AMOXICILLIN-POT CLAVULANATE 875-125 MG PO TABS: 1.0000 | ORAL_TABLET | Freq: Two times a day (BID) | ORAL | 0 refills | Status: AC

## 2023-12-17 MED ORDER — RABIES IMMUNE GLOBULIN 1500 UNIT/10ML IJ SOLN
20.0000 [IU]/kg | Freq: Once | INTRAMUSCULAR | Status: AC
  Administered 2023-12-17: 1500 [IU] via INTRAMUSCULAR

## 2023-12-17 MED ORDER — RABIES VACCINE, PCEC IM SUSR
1.0000 mL | Freq: Once | INTRAMUSCULAR | Status: AC
  Administered 2023-12-17: 1 mL via INTRAMUSCULAR

## 2023-12-17 NOTE — Telephone Encounter (Signed)
 FYI Only or Action Required?: FYI only for provider.  Patient was last seen in primary care on 06/25/2023 by Donzella Lauraine SAILOR, DO.  Called Nurse Triage reporting Animal Bite.  Symptoms began today.  Interventions attempted: Other: pressure.  Symptoms are: unchanged.  Triage Disposition: Go to ED Now (Notify PCP)  Patient/caregiver understands and will follow disposition?: Yes     Copied from CRM [id number]. Topic: Clinical - Red Word Triage >> Dec 17, 2023  8:12 AM Emylou G wrote: [facility] that prompted transfer to Nurse Triage: Patient got bit by a cat.. burning - feels like her hand is on fire Reason for Disposition  [1] Any break in skin from BITE (e.g., cut, puncture or scratch) AND[2] PET animal (e.g., dog, cat, or ferret) at risk for RABIES (e.g., sick, stray, unprovoked bite, developing country)  Answer Assessment - Initial Assessment Questions 1. APPEARANCE What does it look like?  (e.g., abrasion, bruise, puncture)      Swelling and oozing  2. SIZE: How big is the bite? (e.g., inches, cm; or compare to size of coin, pea, grape, ping pong ball)      3 puncture sites 3. LOCATION: Where is the bite located?      Right hand pointer finger 4. ONSET: When did the bite happen? (e.g., minutes, hours ago)      About 30 minutes ago 5. ANIMAL: What type of animal caused the bite? Is the injury from a bite or a claw? If the animal is a dog or a cat, ask: Was it a pet or a stray? Was it acting ill or behaving strangely?     Stray cat bite 6. RABIES VACCINE : For dog or cat bites, ask: Do you know if the pet is vaccinated against rabies?  (e.g., yes, no, overdue for rabies shot, unknown)     unknown 7. CIRCUMSTANCES: Tell me how this happened.      Stray cat got into her office and she was trying to get it out.  8. TETANUS: When was your last tetanus booster?     unknown  Protocols used: Animal Bite-A-AH

## 2023-12-17 NOTE — ED Provider Notes (Signed)
 [facility]    CSN: [id number] Arrival date & time: 12/17/23  0903      History   Chief Complaint Chief Complaint  Patient presents with   Animal Bite    HPI Gabrielle Parsons is a 38 y.o. female.   HPI  Gabrielle Parsons presents after a wild kitten bite her after it ran into her office. She was trying ot get it out and the kitten bit her. This occurred at 745 AM today.  She applied some peroxide from the first aid kit. She got punctured on the tip of her finger and it was bleeding. She stopped the bleeding with pressure.  She has throbbing pain at the tip of her right index finger.  She has a scratch on her hand too.    She is right handed.   Past Medical History:  Diagnosis Date   Anxiety disorder 03/14/2015   BRCA negative 11/2018   MyRisk neg; IBIS=10.1%/riskscore=13.9%   Complication of anesthesia    Family history of ovarian cancer 11/2018   MyRisk neg   GERD (gastroesophageal reflux disease) 03/14/2015   PONV (postoperative nausea and vomiting)    Scoliosis 03/14/2015    Patient Active Problem List   Diagnosis Date Noted   Attention deficit hyperactivity disorder (ADHD) 07/12/2022   Anxiety disorder 03/14/2015   GERD (gastroesophageal reflux disease) 03/14/2015   Scoliosis 03/14/2015   Chronic back pain 03/01/2013    Past Surgical History:  Procedure Laterality Date   BREAST SURGERY  2000   cosmetic   CESAREAN SECTION     2006, 2013   FRACTURE SURGERY     INCISION AND DRAINAGE WOUND WITH FOREIGN BODY REMOVAL Right 07/04/2022   Procedure: INCISION AND REMOVAL OF FOREIGN BODY, SUBCUTANEOUS TISSUES; SIMPLE;  Surgeon: Cleotilde Barrio, MD;  Location: [facility] ORS;  Service: Orthopedics;  Laterality: Right;   ORIF TIBIA FRACTURE Left 05/14/2020   Procedure: OPEN REDUCTION INTERNAL FIXATION (ORIF) TIBIA FRACTURE(ROD);  Surgeon: Cleotilde Barrio, MD;  Location: [facility] ORS;  Service: Orthopedics;  Laterality: Left;   TONSILLECTOMY  06/03/1989   TUBAL LIGATION  06/03/2006    left tube    OB History     Gravida  4   Para  2   Term  2   Preterm      AB  2   Living  2      SAB  2   IAB      Ectopic      Multiple      Live Births               Home Medications    Prior to Admission medications   Medication Sig Start Date End Date Taking? Authorizing Provider  amoxicillin -clavulanate (AUGMENTIN ) 875-125 MG tablet Take 1 tablet by mouth every 12 (twelve) hours. 12/17/23  Yes Creed Kail, DO  fluconazole  (DIFLUCAN ) 150 MG tablet Take 1 tablet (150 mg total) by mouth once for 1 dose. 12/17/23 12/17/23 Yes Destry Dauber, DO  amphetamine -dextroamphetamine  (ADDERALL) 10 MG tablet Take 1 tablet (10 mg total) by mouth 2 (two) times daily. 09/19/23   Ostwalt, Janna, PA-C    Family History Family History  Problem Relation Age of Onset   Hypertension Mother    Depression Mother    Stroke Father 25   Heart disease Father 59   Clotting disorder Father    Colon cancer Maternal Grandmother 70   Ovarian cancer Maternal Grandmother 35   Diabetes Paternal Grandmother    Heart disease  Paternal Grandfather 105   Breast cancer Neg Hx     Social History Social History   Tobacco Use   Smoking status: Former   Smokeless tobacco: Never   Tobacco comments:    Used to smoke cigarettes 15-24  Vaping Use   Vaping status: Never Used  Substance Use Topics   Alcohol use: Yes    Alcohol/week: 2.0 standard drinks of alcohol    Types: 2 Standard drinks or equivalent per week    Comment: weekly   Drug use: No     Allergies   Hydrocodone    Review of Systems Review of Systems :negative unless otherwise stated in HPI.      Physical Exam Triage Vital Signs ED Triage Vitals  Encounter Vitals Group     BP 12/17/23 0928 117/73     Girls Systolic BP Percentile --      Girls Diastolic BP Percentile --      Boys Systolic BP Percentile --      Boys Diastolic BP Percentile --      Pulse Rate 12/17/23 0928 91     Resp 12/17/23 0928 16      Temp 12/17/23 0928 98.3 F (36.8 C)     Temp Source 12/17/23 0928 Oral     SpO2 12/17/23 0928 98 %     Weight 12/17/23 0927 172 lb 8 oz (78.2 kg)     Height 12/17/23 0927 5' 10 (1.778 m)     Head Circumference --      Peak Flow --      Pain Score 12/17/23 0930 0     Pain Loc --      Pain Education --      Exclude from Growth Chart --    No data found.  Updated Vital Signs BP 117/73 (BP Location: Right Arm)   Pulse 91   Temp 98.3 F (36.8 C) (Oral)   Resp 16   Ht 5' 10 (1.778 m)   Wt 78.2 kg   LMP 12/14/2023 (Exact Date)   SpO2 98%   BMI 24.75 kg/m   Visual Acuity Right Eye Distance:   Left Eye Distance:   Bilateral Distance:    Right Eye Near:   Left Eye Near:    Bilateral Near:     Physical Exam  GEN: alert, well appearing female, in no acute distress  EYES: no scleral injection or discharge CV: regular rate RESP: no increased work of breathing MSK:  Right Hand: Inspection: See skin below. No obvious deformity b/l. No swelling, erythema or bruising b/l Palpation: distal fat pad TTP of right index finger, No swelling in PIP, DIP joints b/l. Flexor digitorum profundus and superficialis tendon functions are intact.  PIP joint collateral ligaments are stable   ROM: Full ROM of the digits and wrist b/l. Fully able to extend and flex finger. Strength: 5/5 strength in the forearm, wrist and interosseus muscles b/l Neurovascular: NV intact b/l NEURO: alert, moves all extremities appropriately SKIN: warm and dry; 4 cm scratch to the lateral thenar eminence of right hand, distal fat pad with 2 puncture wounds on right index finger      UC Treatments / Results  Labs (all labs ordered are listed, but only abnormal results are displayed) Labs Reviewed - No data to display  EKG   Radiology No results found.  Procedures Procedures (including critical care time)  Medications Ordered in UC Medications  Rabies Immune Globulin  SOLN 1,500 Units (1,500 Units  Intramuscular Given 12/17/23  9041)  rabies vaccine  (RABAVERT ) injection 1 mL (1 mL Intramuscular Given 12/17/23 1008)     Initial Impression / Assessment and Plan / UC Course  I have reviewed the triage vital signs and the nursing notes.  Pertinent labs & imaging results that were available during my care of the patient were reviewed by me and considered in my medical decision making (see chart for details).     Stray Cat bite Patient is a 38 y.o. female who presents for cat bite.  Wounds cleansed and covered with sterile dressing. Topical antibiotic applied.  Open lacerations to heal by secondary intention.   This is not a known animal to her and she is unaware of the vaccination status. Recommended rabies vaccine  and immunoglobulin and she is agreeable. Both immunoglobulin and vaccine given. Tetanus is UTD (given 01/21/21). Over-the-counter analgesics as needed. Treat with Augmentin .  Advised to watch for signs of infection. Pt to return for subsequent vaccine doses on day 3,7, and 14.     Final Clinical Impressions(s) / UC Diagnoses   Final diagnoses:  Cat bite, initial encounter  Puncture wound of finger of right hand, initial encounter  Need for immunization against rabies     Discharge Instructions      Stop by the pharmacy to pick up your prescriptions. Monitor for signs of infection. See handout on animal bites.   Return to the urgent care to complete you rabies vaccine  series.      ED Prescriptions     Medication Sig Dispense Auth. Provider   amoxicillin -clavulanate (AUGMENTIN ) 875-125 MG tablet Take 1 tablet by mouth every 12 (twelve) hours. 14 tablet Leondre Taul, DO   fluconazole  (DIFLUCAN ) 150 MG tablet Take 1 tablet (150 mg total) by mouth once for 1 dose. 1 tablet Kriste Berth, DO      PDMP not reviewed this encounter.              Kazzandra Desaulniers, DO 12/17/23 1009

## 2023-12-17 NOTE — ED Triage Notes (Signed)
 Pt c/o cat bite in R hand that occurred this AM. Cat is a stray. Last tetanus 01/2021.

## 2023-12-17 NOTE — Discharge Instructions (Addendum)
 Stop by the pharmacy to pick up your prescriptions. Monitor for signs of infection. See handout on animal bites.   Return to the urgent care to complete you rabies vaccine  series.

## 2023-12-20 ENCOUNTER — Ambulatory Visit
Admission: EM | Admit: 2023-12-20 | Discharge: 2023-12-20 | Disposition: A | Discharge status: Home / Self Care | Payer: Self-pay | Attending: Family Medicine | Admitting: Family Medicine

## 2023-12-20 VITALS — BP 115/73 | HR 85 | Temp 98.5°F | Resp 14

## 2023-12-20 DIAGNOSIS — Z23 Encounter for immunization: Secondary | ICD-10-CM

## 2023-12-20 DIAGNOSIS — Z203 Contact with and (suspected) exposure to rabies: Secondary | ICD-10-CM

## 2023-12-20 MED ORDER — RABIES VACCINE, PCEC IM SUSR
1.0000 mL | Freq: Once | INTRAMUSCULAR | Status: AC
  Administered 2023-12-20: 1 mL via INTRAMUSCULAR

## 2023-12-20 NOTE — ED Triage Notes (Signed)
 Patient is here for Day 3 Rabies Injection.  Patient denies any adverse reaction to the vaccine.

## 2023-12-24 ENCOUNTER — Ambulatory Visit: Payer: Self-pay

## 2023-12-31 ENCOUNTER — Ambulatory Visit: Payer: Self-pay
